# Patient Record
Sex: Female | Born: 1990 | Race: White | Hispanic: No | Marital: Single | Smoking: Never smoker
Health system: Southern US, Community
[De-identification: ages and names within clinical notes are randomized; demographics above are authoritative.]

## PROBLEM LIST (undated history)

## (undated) DIAGNOSIS — R87619 Unspecified abnormal cytological findings in specimens from cervix uteri: Secondary | ICD-10-CM

## (undated) DIAGNOSIS — H5021 Vertical strabismus, right eye: Secondary | ICD-10-CM

## (undated) DIAGNOSIS — R29898 Other symptoms and signs involving the musculoskeletal system: Secondary | ICD-10-CM

## (undated) HISTORY — PX: CLOSED REDUCTION FOREARM FRACTURE: SHX960

## (undated) HISTORY — DX: Unspecified abnormal cytological findings in specimens from cervix uteri: R87.619

---
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
## 2009-01-06 ENCOUNTER — Other Ambulatory Visit: Admission: RE | Admit: 2009-01-06 | Discharge: 2009-01-06 | Payer: Self-pay | Admitting: Obstetrics and Gynecology

## 2009-01-06 DIAGNOSIS — R87619 Unspecified abnormal cytological findings in specimens from cervix uteri: Secondary | ICD-10-CM

## 2009-01-06 HISTORY — DX: Unspecified abnormal cytological findings in specimens from cervix uteri: R87.619

## 2011-03-08 NOTE — Op Note (Signed)
Ellen Mosley, Ellen Mosley              ACCOUNT NO.:  [id number]   MEDICAL RECORD NO.:  [id number]          PATIENT TYPE:  AMB   LOCATION:  DSC                          FACILITY:  [facility]   PHYSICIAN:  Cindee Salt, M.D.       DATE OF BIRTH:  [birth year]   DATE OF PROCEDURE:  09/18/2004  DATE OF DISCHARGE:                                 OPERATIVE REPORT   PREOPERATIVE DIAGNOSIS:  Cat bite left hand.   POSTOPERATIVE DIAGNOSIS:  Cat bite left hand.   OPERATION:  Incision and drainage multiple bites index finger, and incision  and drainage of metacarpophalangeal joint ring finger.   SURGEON:  Cindee Salt, M.D.   ASSISTANT:  Chelsea Primus, M.D.   HISTORY:  The patient is a 20 year old female who suffered multiple cat  bites to her left hand.  This occurred approximately two days prior to being  seen.  She is referred from her pediatrician.   PHYSICAL EXAMINATION:  Reveals pain and swelling over the  metacarpophalangeal joint.  She has small pustules present on the index,  with bites on the middle.   PROCEDURE:  The patient was brought to the operating room, where a general  anesthetic was carried out without difficulty.  She was prepped using  DuraPrep in the supine position, left arm free.  The limb was exsanguinated  with an Esmarch bandage from the wrist proximally.  Tourniquet placed high  on the arm was inflated to 250 mmHg.  Incisions were made over each one of  the pustular areas.  The index finger had two small areas.  Each were  opened, irrigated, and packed.  These proceeded to the subcutaneous tissue.  A bite over the ring finger showed gross inflammation.  The wound was  extended proximally and distally to the ulnar side of the extensor tendon.  Cultures were taken for both aerobic and anaerobic cultures.  The dissection  was then carried down to the extensor tendon.  A puncture wound was noted  through the sagittal fibers on the ulnar aspect.  This area was opened and  found to  enter into the joint.  The joint was opened, copiously irrigated  with saline.  Each of the wounds was then packed with  sterile compressive dressing, and splint was applied.  The patient tolerated  the procedure well.  Prior to discharge from the operating room, she was  given 3 g of Unasyn.  She is discharged home to return to the [facility]  [place] on Friday, on Tylenol No. 3 and Augmentin.       GK/MEDQ  D:  09/18/2004  T:  09/18/2004  Job:  [id number]   cc:   Cindee Salt, M.D.  [address]  [place]  [place] [address]  Fax: [phone]

## 2012-03-24 DIAGNOSIS — F419 Anxiety disorder, unspecified: Secondary | ICD-10-CM | POA: Insufficient documentation

## 2012-04-27 ENCOUNTER — Emergency Department (HOSPITAL_COMMUNITY)
Admission: EM | Admit: 2012-04-27 | Discharge: 2012-04-27 | Disposition: A | Discharge status: Home / Self Care | Payer: BC Managed Care – PPO | Attending: Emergency Medicine | Admitting: Emergency Medicine

## 2012-04-27 ENCOUNTER — Encounter (HOSPITAL_COMMUNITY): Payer: Self-pay | Admitting: *Deleted

## 2012-04-27 DIAGNOSIS — S99919A Unspecified injury of unspecified ankle, initial encounter: Secondary | ICD-10-CM | POA: Insufficient documentation

## 2012-04-27 DIAGNOSIS — S8990XA Unspecified injury of unspecified lower leg, initial encounter: Secondary | ICD-10-CM | POA: Insufficient documentation

## 2012-04-27 DIAGNOSIS — X58XXXA Exposure to other specified factors, initial encounter: Secondary | ICD-10-CM | POA: Insufficient documentation

## 2012-04-27 DIAGNOSIS — S99929A Unspecified injury of unspecified foot, initial encounter: Secondary | ICD-10-CM

## 2012-04-27 NOTE — ED Notes (Signed)
The pt has  A loose nail on her rt great toe.  She was attempting to remove her boot and her nail came loose from the nail bed.  No bleeding

## 2012-04-27 NOTE — Progress Notes (Signed)
Orthopedic Tech Progress Note Patient Details:  Ellen Mosley December 24, 1990 [id number]  Ortho Devices Type of Ortho Device: Postop boot Ortho Device/Splint Location: (R) LE Ortho Device/Splint Interventions: Application   Jennye Moccasin 04/27/2012, 8:11 PM

## 2012-04-27 NOTE — ED Provider Notes (Signed)
History    This chart was scribed for Ellen Baker, MD, MD by Smitty Pluck. The patient was seen in room TR09C and the patient's care was started at 7:18PM.   CSN: [id number]  Arrival date & time 04/27/12  1706   First MD Initiated Contact with Patient 04/27/12 1917      Chief Complaint  Patient presents with  . Toe Injury    (Consider location/radiation/quality/duration/timing/severity/associated sxs/prior treatment) The history is provided by the patient.   Ellen Mosley is a 21 y.o. female who presents to the Emergency Department complaining of right great toe injury onset today. Pt was removing her riding boot and her nail of great toe came loose from toe bed. There is no active bleeding. Pain has been constant without radiation. Denies any other pain.   History reviewed. No pertinent past medical history.  History reviewed. No pertinent past surgical history.  No family history on file.  History  Substance Use Topics  . Smoking status: Never Smoker   . Smokeless tobacco: Not on file  . Alcohol Use: Yes    OB History    Grav Para Term Preterm Abortions TAB SAB Ect Mult Living                  Review of Systems  Constitutional: Negative for fever and chills.  Respiratory: Negative for shortness of breath.   Gastrointestinal: Negative for nausea and vomiting.  Neurological: Negative for weakness.    Allergies  Review of patient's allergies indicates no known allergies.  Home Medications   Current Outpatient Rx  Name Route Sig Dispense Refill  . ACETAMINOPHEN 500 MG PO TABS Oral Take 1,000 mg by mouth once.      BP 118/76  Pulse 88  Temp 98.3 F (36.8 C) (Oral)  Resp 18  SpO2 100%  Physical Exam  Nursing note and vitals reviewed. Constitutional: She is oriented to person, place, and time. She appears well-developed and well-nourished. No distress.  HENT:  Head: Normocephalic and atraumatic.  Eyes: EOM are normal. Pupils are equal, round, and  reactive to light.  Neck: Normal range of motion. Neck supple. No tracheal deviation present.  Cardiovascular: Normal rate.   Pulmonary/Chest: Effort normal. No respiratory distress.  Abdominal: Soft. She exhibits no distension.  Musculoskeletal:       No subungual hematoma of right great toe  Dry blood at junction of toenail and skin of right great toe Full ROM at right great toe   Neurological: She is alert and oriented to person, place, and time.  Skin: Skin is warm and dry.  Psychiatric: She has a normal mood and affect. Her behavior is normal.    ED Course  Procedures (including critical care time) DIAGNOSTIC STUDIES: Oxygen Saturation is 100% on room air, normal by my interpretation.    COORDINATION OF CARE:    Labs Reviewed - No data to display No results found.   No diagnosis found.    MDM  Toe nail polish removed and no evidence of nail bed injury-nail is attached to the toe--wound cleaned copiusly and dressed by nursing with bacitracin  I personally performed the services described in this documentation, which was scribed in my presence. The recorded information has been reviewed and considered.         Ellen Baker, MD 04/27/12 [phone]

## 2013-01-15 ENCOUNTER — Encounter: Payer: Self-pay | Admitting: *Deleted

## 2013-01-15 ENCOUNTER — Telehealth: Payer: Self-pay | Admitting: Nurse Practitioner

## 2013-01-15 ENCOUNTER — Encounter: Payer: Self-pay | Admitting: Nurse Practitioner

## 2013-01-15 ENCOUNTER — Ambulatory Visit (INDEPENDENT_AMBULATORY_CARE_PROVIDER_SITE_OTHER): Payer: BC Managed Care – PPO | Admitting: Nurse Practitioner

## 2013-01-15 VITALS — BP 120/70 | HR 64 | Resp 16 | Wt 142.0 lb

## 2013-01-15 DIAGNOSIS — N83209 Unspecified ovarian cyst, unspecified side: Secondary | ICD-10-CM

## 2013-01-15 MED ORDER — ETONOGESTREL-ETHINYL ESTRADIOL 0.12-0.015 MG/24HR VA RING: VAGINAL_RING | VAGINAL | Status: DC

## 2013-01-15 NOTE — Telephone Encounter (Signed)
Spoke to pt who has been having occasional pelvic pains and "twinges" on right side near hipbone, more noticeable when pt active. Pt also interested in restarting Nuvaring, which she stopped about 6 mon ago due to acne.  Pt noticed that even on OCP she still was having acne. Pt has seen dermatologist and gotten acne cleared up. Would like to try nuvaring again because she liked it better than OCP. Sched OV today with PG at 3:30 per pt request. aa

## 2013-01-15 NOTE — Telephone Encounter (Signed)
question re: starting nuvaring again and also having pelvic pain/

## 2013-01-15 NOTE — Progress Notes (Deleted)
Subjective:     Patient ID: Ellen Mosley, female   DOB: 07/29/1991, 22 y.o.   MRN: [id number]  Pelvic Pain The patient's primary symptoms include pelvic pain. The patient's pertinent negatives include no genital itching, genital lesions, genital odor, genital rash, missed menses, vaginal bleeding or vaginal discharge. This is a new problem. The current episode started in the past 7 days. The problem occurs intermittently. The problem has been unchanged. The pain is moderate. The problem affects both sides. She is not pregnant. Associated symptoms include abdominal pain. Pertinent negatives include no anorexia, back pain, chills, constipation, diarrhea, dysuria, fever, flank pain, frequency, headaches, hematuria, joint swelling, nausea, painful intercourse, rash, sore throat or urgency.  This patient complains of onset RLQ pain on 01/11/13.   Initially pain scale 7/10 but since then intermittent and not as painful.  Her last menses about 12/30/12 and decided to come off the pill Ortho LO to give herself a break because acne got so bad.  She has not been sexually active for 3 months, but is with the same partner. She is on Dorox from dermatologist and initially felt symptoms were from taking this medication.  She had no GI symptoms  during this time.  No breakthrough bleeding.  Pain was aggravated with putting right foot forward to take a step or go up the stairs. No history of injury or trauma and no changes other than trying to be active.   Review of Systems  Constitutional: Positive for activity change. Negative for fever and chills.  HENT: Negative for sore throat.   Respiratory: Negative.   Cardiovascular: Negative.   Gastrointestinal: Positive for abdominal pain. Negative for nausea, diarrhea, constipation, abdominal distention and anorexia.  Endocrine: Negative.   Genitourinary: Positive for pelvic pain. Negative for dysuria, urgency, frequency, hematuria, flank pain, vaginal discharge and missed  menses.  Musculoskeletal: Negative for back pain.  Skin: Negative for rash.  Neurological: Negative.  Negative for headaches.  Psychiatric/Behavioral: Negative.        Objective:   Physical Exam  Constitutional: She appears well-developed and well-nourished.  HENT:  Head: Normocephalic.  Cardiovascular: Normal rate and regular rhythm.   Abdomen     Assessment:     ***    Plan:     ***

## 2013-01-15 NOTE — Patient Instructions (Addendum)
Ovarian Cyst The ovaries are small organs that are on each side of the uterus. The ovaries are the organs that produce the female hormones, estrogen and progesterone. An ovarian cyst is a sac filled with fluid that can vary in its size. It is normal for a small cyst to form in women who are in the childbearing age and who have menstrual periods. This type of cyst is called a follicle cyst that becomes an ovulation cyst (corpus luteum cyst) after it produces the women's egg. It later goes away on its own if the woman does not become pregnant. There are other kinds of ovarian cysts that may cause problems and may need to be treated. The most serious problem is a cyst with cancer. It should be noted that menopausal women who have an ovarian cyst are at a higher risk of it being a cancer cyst. They should be evaluated very quickly, thoroughly and followed closely. This is especially true in menopausal women because of the high rate of ovarian cancer in women in menopause. CAUSES AND TYPES OF OVARIAN CYSTS:  FUNCTIONAL CYST: The follicle/corpus luteum cyst is a functional cyst that occurs every month during ovulation with the menstrual cycle. They go away with the next menstrual cycle if the woman does not get pregnant. Usually, there are no symptoms with a functional cyst.  ENDOMETRIOMA CYST: This cyst develops from the lining of the uterus tissue. This cyst gets in or on the ovary. It grows every month from the bleeding during the menstrual period. It is also called a "chocolate cyst" because it becomes filled with blood that turns brown. This cyst can cause pain in the lower abdomen during intercourse and with your menstrual period.  CYSTADENOMA CYST: This cyst develops from the cells on the outside of the ovary. They usually are not cancerous. They can get very big and cause lower abdomen pain and pain with intercourse. This type of cyst can twist on itself, cut off its blood supply and cause severe pain. It  also can easily rupture and cause a lot of pain.  DERMOID CYST: This type of cyst is sometimes found in both ovaries. They are found to have different kinds of body tissue in the cyst. The tissue includes skin, teeth, hair, and/or cartilage. They usually do not have symptoms unless they get very big. Dermoid cysts are rarely cancerous.  POLYCYSTIC OVARY: This is a rare condition with hormone problems that produces many small cysts on both ovaries. The cysts are follicle-like cysts that never produce an egg and become a corpus luteum. It can cause an increase in body weight, infertility, acne, increase in body and facial hair and lack of menstrual periods or rare menstrual periods. Many women with this problem develop type 2 diabetes. The exact cause of this problem is unknown. A polycystic ovary is rarely cancerous.  THECA LUTEIN CYST: Occurs when too much hormone (human chorionic gonadotropin) is produced and over-stimulates the ovaries to produce an egg. They are frequently seen when doctors stimulate the ovaries for invitro-fertilization (test tube babies).  LUTEOMA CYST: This cyst is seen during pregnancy. Rarely it can cause an obstruction to the birth canal during labor and delivery. They usually go away after delivery. SYMPTOMS   Pelvic pain or pressure.  Pain during sexual intercourse.  Increasing girth (swelling) of the abdomen.  Abnormal menstrual periods.  Increasing pain with menstrual periods.  You stop having menstrual periods and you are not pregnant. DIAGNOSIS  The diagnosis can   be made during:  Routine or annual pelvic examination (common).  Ultrasound.  X-ray of the pelvis.  CT Scan.  MRI.  Blood tests. TREATMENT   Treatment may only be to follow the cyst monthly for 2 to 3 months with your caregiver. Many go away on their own, especially functional cysts.  May be aspirated (drained) with a long needle with ultrasound, or by laparoscopy (inserting a tube into  the pelvis through a small incision).  The whole cyst can be removed by laparoscopy.  Sometimes the cyst may need to be removed through an incision in the lower abdomen.  Hormone treatment is sometimes used to help dissolve certain cysts.  Birth control pills are sometimes used to help dissolve certain cysts. HOME CARE INSTRUCTIONS  Follow your caregiver's advice regarding:  Medicine.  Follow up visits to evaluate and treat the cyst.  You may need to come back or make an appointment with another caregiver, to find the exact cause of your cyst, if your caregiver is not a gynecologist.  Get your yearly and recommended pelvic examinations and Pap tests.  Let your caregiver know if you have had an ovarian cyst in the past. SEEK MEDICAL CARE IF:   Your periods are late, irregular, they stop, or are painful.  Your stomach (abdomen) or pelvic pain does not go away.  Your stomach becomes larger or swollen.  You have pressure on your bladder or trouble emptying your bladder completely.  You have painful sexual intercourse.  You have feelings of fullness, pressure, or discomfort in your stomach.  You lose weight for no apparent reason.  You feel generally ill.  You become constipated.  You lose your appetite.  You develop acne.  You have an increase in body and facial hair.  You are gaining weight, without changing your exercise and eating habits.  You think you are pregnant. SEEK IMMEDIATE MEDICAL CARE IF:   You have increasing abdominal pain.  You feel sick to your stomach (nausea) and/or vomit.  You develop a fever that comes on suddenly.  You develop abdominal pain during a bowel movement.  Your menstrual periods become heavier than usual. Document Released: 10/07/2005 Document Revised: 12/30/2011 Document Reviewed: 08/10/2009 [facility] Patient Information 2013 [place], [place].   Take 3-4 Advil prn for pain, rest over the weekend.  If symptoms get worse to  call back.  Start Nuva ring after next cycle.

## 2013-01-15 NOTE — Progress Notes (Signed)
Subjective:     Patient ID: Ellen Mosley, female   DOB: 1991-04-20, 22 y.o.   MRN: [id number]  Pelvic Pain The patient's primary symptoms include pelvic pain. Primary symptoms comment: Patient states the pain had a sudden onset on 01/11/13 RLQ with radiation to the left. Describes the pain is sharp and achy at times.  Pain scale at onset was 7/10. But now somewhat improved and intermittent in nature.. This is a new problem. The problem has been gradually improving. The problem affects both sides. She is not pregnant. Associated symptoms include abdominal pain. Pertinent negatives include no constipation, diarrhea, dysuria, flank pain, hematuria, nausea or vomiting. Exacerbated by: Going up stairs especially starting on right foot. She has tried NSAIDs for the symptoms. The treatment provided mild relief. Sexual activity: Not sexually active for past 3 months, but still with same partner.  No concerns about STD's. Contraceptive use: She was on Ortho Lo until this past cycle when she discontinued because acne was flared.  Since then has seen dermatologist and is now on Doxycycline.  She desires to go back on [employer]. Her menstrual history has been regular. (Abnormal pap with LGSIL and colpo without biopsy 10/10 )     Review of Systems  Constitutional: Positive for activity change.       Some increase in activity at the Gym until this past week without history of injury or trauma.  Respiratory: Negative.   Cardiovascular: Negative.   Gastrointestinal: Positive for abdominal pain. Negative for nausea, vomiting, diarrhea, constipation, blood in stool, abdominal distention, anal bleeding and rectal pain.       Abdominal pain as noted without history of IBS.  Endocrine: Negative.   Genitourinary: Positive for pelvic pain. Negative for dysuria, hematuria and flank pain.       LMP about 12/30/12 and was regular.  Musculoskeletal:       No injury and back injury. No sciatica.  Skin:       As noted a  flare of acne. Now on Doxy.  Psychiatric/Behavioral: Negative.        Objective:   Physical Exam  Constitutional: She appears well-developed and well-nourished. No distress.  Cardiovascular: Normal rate and regular rhythm.   Pulmonary/Chest: Effort normal and breath sounds normal.  Abdominal: Soft. Normal appearance. She exhibits no distension, no pulsatile liver and no mass. There is no hepatosplenomegaly. There is tenderness in the right lower quadrant. There is no rebound, no guarding and no CVA tenderness.  Genitourinary: Vagina normal. No erythema, tenderness or bleeding around the vagina. No vaginal discharge found.  Bimanual with tenderness on deep palpation on right.  No obvious mass but tenderness consistent with ovarian origin.  Lymphadenopathy:       Right: No inguinal adenopathy present.       Left: No inguinal adenopathy present.  Neurological: She is alert.  Skin: Skin is warm and dry.  Psychiatric: She has a normal mood and affect. Her speech is normal and behavior is normal. Judgment normal. Cognition and memory are normal.       Assessment:     Pelvic / Abdominal pain most consistent with Ovarian Cyst  Wants to restart Nuva Ring for birth control and cycle regulation and hopes that she will get acne control as well.    Plan:      Samples of Nuva Ring X 3 months.  May need new Rx before AEX and to call back  OTC NSAID'S prn for abdominal pain.  Warm heating  pad prn.  Call back if symptoms worsens in any way.  Rest over the weekend.

## 2013-01-15 NOTE — Telephone Encounter (Signed)
[id number] 

## 2013-01-18 NOTE — Progress Notes (Signed)
Encounter reviewed by Dr. Brook Silva.  

## 2013-04-28 ENCOUNTER — Telehealth: Payer: Self-pay | Admitting: Nurse Practitioner

## 2013-04-28 MED ORDER — ETONOGESTREL-ETHINYL ESTRADIOL 0.12-0.015 MG/24HR VA RING: VAGINAL_RING | VAGINAL | Status: DC

## 2013-04-28 NOTE — Telephone Encounter (Signed)
Left message for patient that her rx for Nuvaring was sent to the pharmacy #[id number] and AEX is due after 07/13/13. She will need to call back and reschedule this.

## 2013-04-28 NOTE — Telephone Encounter (Signed)
Please call in refill for nuvaring  [facility] [address]

## 2013-05-10 ENCOUNTER — Telehealth: Payer: Self-pay | Admitting: Nurse Practitioner

## 2013-05-10 NOTE — Telephone Encounter (Signed)
Pt's Rx for Nuv[facility] was sent to [facility] not [facility] per pt's request. Pt advised to call [facility] pharmacy and have Rx transferred. Pt is happy and agreeable.  

## 2013-05-10 NOTE — Telephone Encounter (Signed)
Pt's Rx for Nuvaring was sent to [facility] per pt's [facility]. Pt advised to call [facility] pharmacy and have Rx transferred. Pt is happy and agreeable.

## 2013-05-10 NOTE — Telephone Encounter (Signed)
Patient left message on answering machine 05/07/13 at 9:12 PM re: recent refill sent to wrong pharmacy and will cost patient more money there. Patient requests we call [facility] [address] [facility]

## 2013-05-14 ENCOUNTER — Ambulatory Visit: Payer: BC Managed Care – PPO | Admitting: Nurse Practitioner

## 2013-05-17 ENCOUNTER — Ambulatory Visit (INDEPENDENT_AMBULATORY_CARE_PROVIDER_SITE_OTHER): Payer: BC Managed Care – PPO | Admitting: Nurse Practitioner

## 2013-05-17 ENCOUNTER — Encounter: Payer: Self-pay | Admitting: Nurse Practitioner

## 2013-05-17 VITALS — BP 102/60 | HR 74 | Resp 12 | Ht 69.0 in | Wt 145.6 lb

## 2013-05-17 DIAGNOSIS — Z975 Presence of (intrauterine) contraceptive device: Secondary | ICD-10-CM

## 2013-05-17 NOTE — Progress Notes (Signed)
Subjective:     Patient ID: Ellen Mosley, female   DOB: 10/11/1991, 22 y.o.   MRN: [id number]  HPI 22 yo [place] who was on Nuva Ring for contraception until 1 month ago.  She comes in a consult visit seeking other methods of cycle regulation and birth control. Will be traveling after graduation to [place] and wants to use Mirena IUD.  Not been sexually active dating since 11/2012.  She has a friend with a Mirena IUD and has done well ans she likes the idea of none to light menses.   Review of Systems  Constitutional: Negative.   Respiratory: Negative.   Cardiovascular: Negative.   Gastrointestinal: Negative.   Genitourinary: Negative.   Skin: Negative.        Slight acne.  Neurological: Negative.   Psychiatric/Behavioral: Negative.        Objective:   Physical Exam  Constitutional: She appears well-developed and well-nourished.  Exam is not indicated today.  Psychiatric: She has a normal mood and affect. Her behavior is normal. Judgment and thought content normal.       Assessment:     Desires Mirena IUD    Plan:    If as planned, her menses starts this Thursday or Friday will want IUD this week.  We will get insurance information sent and follow.   Consult time with patient 15 minutes.  Information is given for her to review.

## 2013-05-17 NOTE — Patient Instructions (Signed)
We will call you when insurance approval and get done later this week for Mirena IUD, or early next week if cycle is later than expected.

## 2013-05-19 NOTE — Progress Notes (Signed)
Encounter reviewed by Dr. Thermon Zulauf Silva.  

## 2013-05-21 ENCOUNTER — Ambulatory Visit: Payer: BC Managed Care – PPO | Admitting: Obstetrics and Gynecology

## 2013-05-21 ENCOUNTER — Telehealth: Payer: Self-pay | Admitting: Obstetrics and Gynecology

## 2013-05-21 NOTE — Telephone Encounter (Signed)
Patient called to cancel IUD placement . Patient left message this mornings messages to cancel . Could not understand the reason for her cancellation. Tried to call back . Had to leave a message on her phone.

## 2013-05-24 ENCOUNTER — Telehealth: Payer: Self-pay | Admitting: Nurse Practitioner

## 2013-05-24 NOTE — Telephone Encounter (Signed)
Call to patient to advise $35 copay for appt 8/5. Voicemail is for her work. Did not leave a message.

## 2013-05-24 NOTE — Telephone Encounter (Signed)
Spoke with pt to advise that CR did not require her to take cytotec. Pt agreeable.

## 2013-05-24 NOTE — Telephone Encounter (Addendum)
Spoke with pt who started menses 05-21-13. Has discussed Mirena with PG at earlier visit. Desires placement. Per SY, appt sched with CR tomorrow at 3:30. Pt agreeable. Instructed pt to take 800 mg of ibuprofen an hour before the appt with food to help with cramping. Informed pt I would inquire about med Cytotec and call her back if it is needed. Advised someone from insurance would be calling with her OOP cost.

## 2013-05-24 NOTE — Telephone Encounter (Signed)
Patient has started her period on Friday . Needs an appointment for her IUD placement.

## 2013-05-25 ENCOUNTER — Ambulatory Visit (INDEPENDENT_AMBULATORY_CARE_PROVIDER_SITE_OTHER): Payer: BC Managed Care – PPO | Admitting: Obstetrics and Gynecology

## 2013-05-25 ENCOUNTER — Encounter: Payer: Self-pay | Admitting: Obstetrics and Gynecology

## 2013-05-25 VITALS — BP 102/60 | HR 74 | Resp 18 | Wt 146.0 lb

## 2013-05-25 DIAGNOSIS — Z30430 Encounter for insertion of intrauterine contraceptive device: Secondary | ICD-10-CM

## 2013-05-25 DIAGNOSIS — Z975 Presence of (intrauterine) contraceptive device: Secondary | ICD-10-CM

## 2013-05-25 NOTE — Progress Notes (Signed)
22 yo Single Caucasian female presents for  insertion of Mirena. Risks and possible complications of this method of birth control fully discussed with patient.  She states she understands and wishes to proceed. Informed consent obtained.    Patient's last menstrual period was 05/21/2013.  Exam:  Healthy appearing female  Abd:  Soft and NT  Pelvic:  Ext genitalia normal female              Vagina normal              Cervix appears nl with no sign of infection              Bimanual exam:     Procedure:  Speculum inserted into vagina. Cervix visualized and cleansed with betadine solution X 3. Cervix grasped on its anterior lip with a single tooth tenaculum.  Uterus sounded to 7 centimeters.  IUD removed from sterile packet and under sterile conditions inserted to fundus of uterus.  Introducer removed without difficulty.  IUD string trimmed to 3 centimeters.  Remainder string given to patient to feel for identification.  Tenaculum removed  Speculum removed.  Uterus palpated normal.  Patient tolerated procedure well.  A: Insertion of Mirena   Lot number:   [id number]      Exp: 08/16   P:  Instructions and warnings signs given.       IUD identification card given with IUD removal        F/U 1 month for IUD recheck.   AVS given to patient with post IUD care instructions.

## 2013-05-25 NOTE — Patient Instructions (Signed)
Intrauterine Device Insertion Intrauterine Device Insertion Care After Refer to this sheet in the next few weeks. These instructions provide you with information on caring for yourself after your procedure. Your caregiver may also give you more specific instructions. Your treatment has been planned according to current medical practices, but problems sometimes occur. Call your caregiver if you have any problems or questions after your procedure. HOME CARE INSTRUCTIONS   Only take over-the-counter or prescription medicines for pain, discomfort, or fever as directed by your caregiver. Do not use aspirin. This may increase bleeding.  Check your IUD to make sure it is in place before you resume sexual activity. You should be able to feel the strings. If you cannot feel the strings, something may be wrong. The IUD may have fallen out of the uterus, or the uterus may have been punctured (perforated) during placement. Also, if the strings are getting longer, it may mean that the IUD is being forced out of the uterus. You no longer have full protection from pregnancy if any of these problems occur.  You may resume sexual intercourse if you are not having problems with the IUD. The IUD is considered immediately effective.  You may resume normal activities.  Keep all follow-up appointments to be sure your IUD has remained in place. After the first exam, yearly exams are advised, unless you cannot feel the strings of your IUD.  Continue to check that the IUD is still in place by feeling for the strings after every menstrual period. SEEK MEDICAL CARE IF:   You have bleeding that is heavier or lasts longer than a normal menstrual cycle.  You have a fever.  You have increasing cramps or abdominal pain not relieved with medicine.  You have abdominal pain that does not seem to be related to the same area of earlier cramping and pain.  You are lightheaded, unusually weak, or faint.  You have abnormal  vaginal discharge or smells.  You have pain during sexual intercourse.  You cannot feel the IUD strings, or the IUD string has gotten longer.  You feel the IUD at the opening of the cervix in the vagina.  You think you are pregnant, or you miss your menstrual period.  The IUD string is hurting your sex partner. Document Released: 06/05/2011 Document Revised: 12/30/2011 Document Reviewed: 06/05/2011 [facility] Patient Information 2014 [place], [place].

## 2013-06-23 ENCOUNTER — Ambulatory Visit: Payer: BC Managed Care – PPO | Admitting: Obstetrics and Gynecology

## 2013-06-28 ENCOUNTER — Ambulatory Visit: Payer: BC Managed Care – PPO | Admitting: Obstetrics and Gynecology

## 2013-07-05 ENCOUNTER — Telehealth: Payer: Self-pay | Admitting: Obstetrics and Gynecology

## 2013-07-05 ENCOUNTER — Ambulatory Visit: Payer: Self-pay | Admitting: Obstetrics and Gynecology

## 2013-07-05 NOTE — Telephone Encounter (Signed)
Patient canceled 4 week reck appointment with Dr.Silva today.  Patient rescheduled to 07/16/13 with Dr.Silva.

## 2013-07-16 ENCOUNTER — Ambulatory Visit: Payer: Self-pay | Admitting: Obstetrics and Gynecology

## 2013-07-19 ENCOUNTER — Encounter: Payer: Self-pay | Admitting: Obstetrics and Gynecology

## 2013-07-19 ENCOUNTER — Ambulatory Visit (INDEPENDENT_AMBULATORY_CARE_PROVIDER_SITE_OTHER): Payer: BC Managed Care – PPO | Admitting: Obstetrics and Gynecology

## 2013-07-19 VITALS — BP 122/76 | HR 64 | Ht 69.0 in | Wt 143.5 lb

## 2013-07-19 DIAGNOSIS — Z30431 Encounter for routine checking of intrauterine contraceptive device: Secondary | ICD-10-CM

## 2013-07-19 NOTE — Patient Instructions (Signed)
Please return for your annual exam appointment.  It was so nice to meet you!

## 2013-07-19 NOTE — Progress Notes (Signed)
Patient ID: Ellen Mosley, female   DOB: 1991-02-04, 22 y.o.   MRN: [id number]  Subjective  Patient is here for a Mirena IUD check.  IUD placed on 05/25/13.   Loves it! Spotted for only a few days after insertion, but no periods.  Had some cramping two weeks after IUD placed, but now gone.   Not sexually active.  Wants to do equine therapy and open her own facility.  Is an [place] major.  Teaches riding.  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  Objective  Pelvic - normal external genitalia and urethra. Cervix - IUD strings noted - 1 inch in length, trimmed to 1/2 inch. Uterus - small and nontender. Adnexa - no masses or tenderness.  Assessment  Mirena IUD patient. Doing well.  Plan  Annual examination is due.  Patient will schedule.

## 2013-07-26 ENCOUNTER — Encounter: Payer: Self-pay | Admitting: Nurse Practitioner

## 2013-07-26 ENCOUNTER — Ambulatory Visit (INDEPENDENT_AMBULATORY_CARE_PROVIDER_SITE_OTHER): Payer: BC Managed Care – PPO | Admitting: Nurse Practitioner

## 2013-07-26 VITALS — BP 100/70 | HR 68 | Resp 16 | Ht 68.25 in | Wt 141.0 lb

## 2013-07-26 DIAGNOSIS — Z0000 Encounter for general adult medical examination without abnormal findings: Secondary | ICD-10-CM

## 2013-07-26 DIAGNOSIS — Z01419 Encounter for gynecological examination (general) (routine) without abnormal findings: Secondary | ICD-10-CM

## 2013-07-26 DIAGNOSIS — Z113 Encounter for screening for infections with a predominantly sexual mode of transmission: Secondary | ICD-10-CM

## 2013-07-26 LAB — HEMOGLOBIN, FINGERSTICK: Hemoglobin, fingerstick: 14 g/dL (ref 12.0–16.0)

## 2013-07-26 LAB — POCT URINALYSIS DIPSTICK
Bilirubin, UA: NEGATIVE
Glucose, UA: NEGATIVE
Ketones, UA: NEGATIVE
Leukocytes, UA: NEGATIVE

## 2013-07-26 LAB — STD PANEL: Hepatitis B Surface Ag: NEGATIVE

## 2013-07-26 NOTE — Patient Instructions (Addendum)
General topics  Next pap or exam is  due in 1 year Take a Women's multivitamin Take 1200 mg. of calcium daily - prefer dietary If any concerns in interim to call back  Breast Self-Awareness Practicing breast self-awareness may pick up problems early, prevent significant medical complications, and possibly save your life. By practicing breast self-awareness, you can become familiar with how your breasts look and feel and if your breasts are changing. This allows you to notice changes early. It can also offer you some reassurance that your breast health is good. One way to learn what is normal for your breasts and whether your breasts are changing is to do a breast self-exam. If you find a lump or something that was not present in the past, it is best to contact your caregiver right away. Other findings that should be evaluated by your caregiver include nipple discharge, especially if it is bloody; skin changes or reddening; areas where the skin seems to be pulled in (retracted); or new lumps and bumps. Breast pain is seldom associated with cancer (malignancy), but should also be evaluated by a caregiver. BREAST SELF-EXAM The best time to examine your breasts is 5 7 days after your menstrual period is over.  ExitCare Patient Information 2013 [employer].   Exercise to Stay Healthy Exercise helps you become and stay healthy. EXERCISE IDEAS AND TIPS Choose exercises that:  You enjoy.  Fit into your day. You do not need to exercise really hard to be healthy. You can do exercises at a slow or medium level and stay healthy. You can:  Stretch before and after working out.  Try yoga, Pilates, or tai chi.  Lift weights.  Walk fast, swim, jog, run, climb stairs, bicycle, dance, or rollerskate.  Take aerobic classes. Exercises that burn about 150 calories:  Running 1  miles in 15 minutes.  Playing volleyball for 45 to 60 minutes.  Washing and waxing a car for 45 to 60  minutes.  Playing touch football for 45 minutes.  Walking 1  miles in 35 minutes.  Pushing a stroller 1  miles in 30 minutes.  Playing basketball for 30 minutes.  Raking leaves for 30 minutes.  Bicycling 5 miles in 30 minutes.  Walking 2 miles in 30 minutes.  Dancing for 30 minutes.  Shoveling snow for 15 minutes.  Swimming laps for 20 minutes.  Walking up stairs for 15 minutes.  Bicycling 4 miles in 15 minutes.  Gardening for 30 to 45 minutes.  Jumping rope for 15 minutes.  Washing windows or floors for 45 to 60 minutes. Document Released: 11/09/2010 Document Revised: 12/30/2011 Document Reviewed: 11/09/2010 ExitCare Patient Information 2013 [employer].   Other topics ( that may be useful information):    Sexually Transmitted Disease Sexually transmitted disease (STD) refers to any infection that is passed from person to person during sexual activity. This may happen by way of saliva, semen, blood, vaginal mucus, or urine. Common STDs include:  Gonorrhea.  Chlamydia.  Syphilis.  HIV/AIDS.  Genital herpes.  Hepatitis B and C.  Trichomonas.  Human papillomavirus (HPV).  Pubic lice. CAUSES  An STD may be spread by bacteria, virus, or parasite. A person can get an STD by:  Sexual intercourse with an infected person.  Sharing sex toys with an infected person.  Sharing needles with an infected person.  Having intimate contact with the genitals, mouth, or rectal areas of an infected person. SYMPTOMS  Some people may not have any symptoms, but   they can still pass the infection to others. Different STDs have different symptoms. Symptoms include:  Painful or bloody urination.  Pain in the pelvis, abdomen, vagina, anus, throat, or eyes.  Skin rash, itching, irritation, growths, or sores (lesions). These usually occur in the genital or anal area.  Abnormal vaginal discharge.  Penile discharge in men.  Soft, flesh-colored skin growths in the  genital or anal area.  Fever.  Pain or bleeding during sexual intercourse.  Swollen glands in the groin area.  Yellow skin and eyes (jaundice). This is seen with hepatitis. DIAGNOSIS  To make a diagnosis, your caregiver may:  Take a medical history.  Perform a physical exam.  Take a specimen (culture) to be examined.  Examine a sample of discharge under a microscope.  Perform blood test TREATMENT   Chlamydia, gonorrhea, trichomonas, and syphilis can be cured with antibiotic medicine.  Genital herpes, hepatitis, and HIV can be treated, but not cured, with prescribed medicines. The medicines will lessen the symptoms.  Genital warts from HPV can be treated with medicine or by freezing, burning (electrocautery), or surgery. Warts may come back.  HPV is a virus and cannot be cured with medicine or surgery.However, abnormal areas may be followed very closely by your caregiver and may be removed from the cervix, vagina, or vulva through office procedures or surgery. If your diagnosis is confirmed, your recent sexual partners need treatment. This is true even if they are symptom-free or have a negative culture or evaluation. They should not have sex until their caregiver says it is okay. HOME CARE INSTRUCTIONS  All sexual partners should be informed, tested, and treated for all STDs.  Take your antibiotics as directed. Finish them even if you start to feel better.  Only take over-the-counter or prescription medicines for pain, discomfort, or fever as directed by your caregiver.  Rest.  Eat a balanced diet and drink enough fluids to keep your urine clear or pale yellow.  Do not have sex until treatment is completed and you have followed up with your caregiver. STDs should be checked after treatment.  Keep all follow-up appointments, Pap tests, and blood tests as directed by your caregiver.  Only use latex condoms and water-soluble lubricants during sexual activity. Do not use  petroleum jelly or oils.  Avoid alcohol and illegal drugs.  Get vaccinated for HPV and hepatitis. If you have not received these vaccines in the past, talk to your caregiver about whether one or both might be right for you.  Avoid risky sex practices that can break the skin. The only way to avoid getting an STD is to avoid all sexual activity.Latex condoms and dental dams (for oral sex) will help lessen the risk of getting an STD, but will not completely eliminate the risk. SEEK MEDICAL CARE IF:   You have a fever.  You have any new or worsening symptoms. Document Released: 12/28/2002 Document Revised: 12/30/2011 Document Reviewed: 01/04/2011 ExitCare Patient Information 2013 [employer]Care[employer].    Domestic Abuse You are being battered or abused if someone close to you hits, pushes, or physically hurts you in any way. You also are being abused if you are forced into activities. You are being sexually abused if you are forced to have sexual contact of any kind. You are being emotionally abused if you are made to feel worthless or if you are constantly threatened. It is important to remember that help is available. No one has the right to abuse you. PREVENTION OF FURTHER   ABUSE  Learn the warning signs of danger. This varies with situations but may include: the use of alcohol, threats, isolation from friends and family, or forced sexual contact. Leave if you feel that violence is going to occur.  If you are attacked or beaten, report it to the police so the abuse is documented. You do not have to press charges. The police can protect you while you or the attackers are leaving. Get the officer's name and badge number and a copy of the report.  Find someone you can trust and tell them what is happening to you: your caregiver, a nurse, clergy member, close friend or family member. Feeling ashamed is natural, but remember that you have done nothing wrong. No one deserves abuse. Document Released:  10/04/2000 Document Revised: 12/30/2011 Document Reviewed: 12/13/2010 ExitCare Patient Information 2013 [employer].    How Much is Too Much Alcohol? Drinking too much alcohol can cause injury, accidents, and health problems. These types of problems can include:   Car crashes.  Falls.  Family fighting (domestic violence).  Drowning.  Fights.  Injuries.  Burns.  Damage to certain organs.  Having a baby with birth defects. ONE DRINK CAN BE TOO MUCH WHEN YOU ARE:  Working.  Pregnant or breastfeeding.  Taking medicines. Ask your doctor.  Driving or planning to drive. If you or someone you know has a drinking problem, get help from a doctor.  Document Released: 08/03/2009 Document Revised: 12/30/2011 Document Reviewed: 08/03/2009 ExitCare Patient Information 2013 [employer].   Smoking Hazards Smoking cigarettes is extremely bad for your health. Tobacco smoke has over 200 known poisons in it. There are over 60 chemicals in tobacco smoke that cause cancer. Some of the chemicals found in cigarette smoke include:   Cyanide.  Benzene.  Formaldehyde.  Methanol (wood alcohol).  Acetylene (fuel used in welding torches).  Ammonia. Cigarette smoke also contains the poisonous gases nitrogen oxide and carbon monoxide.  Cigarette smokers have an increased risk of many serious medical problems and Smoking causes approximately:  90% of all lung cancer deaths in men.  80% of all lung cancer deaths in women.  90% of deaths from chronic obstructive lung disease. Compared with nonsmokers, smoking increases the risk of:  Coronary heart disease by 2 to 4 times.  Stroke by 2 to 4 times.  Men developing lung cancer by 23 times.  Women developing lung cancer by 13 times.  Dying from chronic obstructive lung diseases by 12 times.  . Smoking is the most preventable cause of death and disease in our society.  WHY IS SMOKING ADDICTIVE?  Nicotine is the chemical  agent in tobacco that is capable of causing addiction or dependence.  When you smoke and inhale, nicotine is absorbed rapidly into the bloodstream through your lungs. Nicotine absorbed through the lungs is capable of creating a powerful addiction. Both inhaled and non-inhaled nicotine may be addictive.  Addiction studies of cigarettes and spit tobacco show that addiction to nicotine occurs mainly during the teen years, when young people begin using tobacco products. WHAT ARE THE BENEFITS OF QUITTING?  There are many health benefits to quitting smoking.   Likelihood of developing cancer and heart disease decreases. Health improvements are seen almost immediately.  Blood pressure, pulse rate, and breathing patterns start returning to normal soon after quitting. QUITTING SMOKING   American Lung Association - 1-800-LUNGUSA  American Cancer Society - 1-800-ACS-2345 Document Released: 11/14/2004 Document Revised: 12/30/2011 Document Reviewed: 07/19/2009 ExitCare Patient Information 2013 ExitCare,   LLC.   Stress Management Stress is a state of physical or mental tension that often results from changes in your life or normal routine. Some common causes of stress are:  Death of a loved one.  Injuries or severe illnesses.  Getting fired or changing jobs.  Moving into a new home. Other causes may be:  Sexual problems.  Business or financial losses.  Taking on a large debt.  Regular conflict with someone at home or at work.  Constant tiredness from lack of sleep. It is not just bad things that are stressful. It may be stressful to:  Win the lottery.  Get married.  Buy a new car. The amount of stress that can be easily tolerated varies from person to person. Changes generally cause stress, regardless of the types of change. Too much stress can affect your health. It may lead to physical or emotional problems. Too little stress (boredom) may also become stressful. SUGGESTIONS TO  REDUCE STRESS:  Talk things over with your family and friends. It often is helpful to share your concerns and worries. If you feel your problem is serious, you may want to get help from a professional counselor.  Consider your problems one at a time instead of lumping them all together. Trying to take care of everything at once may seem impossible. List all the things you need to do and then start with the most important one. Set a goal to accomplish 2 or 3 things each day. If you expect to do too many in a single day you will naturally fail, causing you to feel even more stressed.  Do not use alcohol or drugs to relieve stress. Although you may feel better for a short time, they do not remove the problems that caused the stress. They can also be habit forming.  Exercise regularly - at least 3 times per week. Physical exercise can help to relieve that "uptight" feeling and will relax you.  The shortest distance between despair and hope is often a good night's sleep.  Go to bed and get up on time allowing yourself time for appointments without being rushed.  Take a short "time-out" period from any stressful situation that occurs during the day. Close your eyes and take some deep breaths. Starting with the muscles in your face, tense them, hold it for a few seconds, then relax. Repeat this with the muscles in your neck, shoulders, hand, stomach, back and legs.  Take good care of yourself. Eat a balanced diet and get plenty of rest.  Schedule time for having fun. Take a break from your daily routine to relax. HOME CARE INSTRUCTIONS   Call if you feel overwhelmed by your problems and feel you can no longer manage them on your own.  Return immediately if you feel like hurting yourself or someone else. Document Released: 04/02/2001 Document Revised: 12/30/2011 Document Reviewed: 11/23/2007 ExitCare Patient Information 2013 [employer]Care[employer] LLC.   

## 2013-07-26 NOTE — Progress Notes (Signed)
Patient ID: Ellen Mosley, female   DOB: 11/15/1990, 22 y.o.   MRN: [id number] 22 y.o. G0P0 Single Caucasian Fe here for annual exam.  No vaginal bleeding since Mirena IUD insertion last month.  Some cramps for 1/2 day that go away on its own without NSAID's. Not sexually active for 2-3 months. She worked again this year with the [employer]; this is her 5 th year.  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  Patient's last menstrual period was 05/21/2013.          Sexually active: yes  The current method of family planning is IUD.    Exercising: yes  Home exercise routine includes running at least 2 times per week and horseback riding daily. Smoker:  no  Health Maintenance: Pap:  07/13/12, WNL TDaP:  03/2007 Gardasil: completed in 2007 Labs: HB: 14.0 Urine: Negative, pH 6.0   reports that she has never smoked. She has never used smokeless tobacco. She reports that  drinks alcohol.  Past Medical History  Diagnosis Date  . STD (sexually transmitted disease) 05/2010  . Abnormal pap 01/10/11    hx of LSIL with colpo bx    Past Surgical History  Procedure Laterality Date  . Colposcopy    . Cosmetic surgery  2004    Current Outpatient Prescriptions  Medication Sig Dispense Refill  . acetaminophen (TYLENOL) 500 MG tablet Take 1,000 mg by mouth once.       No current facility-administered medications for this visit.    Family History  Problem Relation Age of Onset  . Osteopenia Mother   . Cancer Sister   . Breast cancer Paternal Grandmother     ROS:  Pertinent items are noted in HPI.  Otherwise, a comprehensive ROS was negative.  Exam:   BP 100/70  Pulse 68  Resp 16  Ht 5' 8.25" (1.734 m)  Wt 141 lb (63.957 kg)  BMI 21.27 kg/m2  LMP 05/21/2013 Height: 5' 8.25" (173.4 cm)  Ht Readings from Last 3 Encounters:  07/26/13 5' 8.25" (1.734 m)  07/19/13 5\' 9"  (1.753 m)  05/17/13 5\' 9"   (1.753 m)    General appearance: alert, cooperative and appears stated age Head: Normocephalic, without obvious abnormality, atraumatic Neck: no adenopathy, supple, symmetrical, trachea midline and thyroid normal to inspection and palpation Lungs: clear to auscultation bilaterally Breasts: normal appearance, no masses or tenderness Heart: regular rate and rhythm Abdomen: soft, non-tender; no masses,  no organomegaly Extremities: extremities normal, atraumatic, no cyanosis or edema Skin: Skin color, texture, turgor normal. No rashes or lesions Lymph nodes: Cervical, supraclavicular, and axillary nodes normal. No abnormal inguinal nodes palpated Neurologic: Grossly normal   Pelvic: External genitalia:  no lesions              Urethra:  normal appearing urethra with no masses, tenderness or lesions              Bartholin's and Skene's: normal                 Vagina: normal appearing vagina with normal color and discharge, no lesions              Cervix: anteverted and IUD strings are visible.              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

## 2013-07-30 NOTE — Progress Notes (Signed)
Encounter reviewed by Dr. Agnieszka Newhouse Silva.  

## 2014-01-19 DIAGNOSIS — H5021 Vertical strabismus, right eye: Secondary | ICD-10-CM

## 2014-01-19 HISTORY — DX: Vertical strabismus, right eye: H50.21

## 2014-01-31 ENCOUNTER — Encounter (HOSPITAL_BASED_OUTPATIENT_CLINIC_OR_DEPARTMENT_OTHER): Payer: Self-pay | Admitting: *Deleted

## 2014-02-01 ENCOUNTER — Other Ambulatory Visit: Payer: Self-pay | Admitting: Ophthalmology

## 2014-02-01 ENCOUNTER — Encounter (HOSPITAL_BASED_OUTPATIENT_CLINIC_OR_DEPARTMENT_OTHER): Payer: Self-pay | Admitting: *Deleted

## 2014-02-01 NOTE — H&P (Signed)
  Date of examination:  01-12-14  Indication for surgery: To straighten the eyes and relieve double vision  Pertinent past medical history:  Past Medical History  Diagnosis Date  . 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 Jaw clicking     Pertinent ocular history:  Head trauma 2004, right orbital fracture s/p repair  Pertinent family history:  Family History  Problem Relation Age of Onset  . Osteopenia Mother   . Anesthesia problems Mother     post-op N/V  . Cancer Maternal Aunt   . Cancer Maternal Grandmother   . Anesthesia problems Father     post-op N/V    General:  Healthy appearing patient in no distress.    Eyes:    Acuity cc    OD 20/20  OS 20/20  External: Within normal limits     Anterior segment: Within normal limits     Motility:   R H(T) = 6 in primary.  0 in L gaze, 30 in R gaze.  + HTT to right.  DMR 3 deg excyclo OD.  3+ RIO OA  Refraction: Manifest low minus/mild cyl OU  Heart: Regular rate and rhythm without murmur     Lungs: Clear to auscultation     Abdomen: Soft, nontender, normal bowel sounds     Impression:RH(T) c/w RSO palsy, traumatic by hx  Plan: RIO recess  Shara BlazingWilliam O Corlis Angelica

## 2014-02-04 ENCOUNTER — Ambulatory Visit (HOSPITAL_BASED_OUTPATIENT_CLINIC_OR_DEPARTMENT_OTHER)
Admission: RE | Admit: 2014-02-04 | Discharge: 2014-02-04 | Disposition: A | Discharge status: Home / Self Care | Payer: 59 | Source: Ambulatory Visit | Attending: Ophthalmology | Admitting: Ophthalmology

## 2014-02-04 ENCOUNTER — Ambulatory Visit (HOSPITAL_BASED_OUTPATIENT_CLINIC_OR_DEPARTMENT_OTHER): Payer: 59 | Admitting: Anesthesiology

## 2014-02-04 ENCOUNTER — Encounter (HOSPITAL_BASED_OUTPATIENT_CLINIC_OR_DEPARTMENT_OTHER): Payer: Self-pay | Admitting: Anesthesiology

## 2014-02-04 ENCOUNTER — Encounter (HOSPITAL_BASED_OUTPATIENT_CLINIC_OR_DEPARTMENT_OTHER)
Admission: RE | Disposition: A | Discharge status: Home / Self Care | Payer: Self-pay | Source: Ambulatory Visit | Attending: Ophthalmology

## 2014-02-04 ENCOUNTER — Encounter (HOSPITAL_BASED_OUTPATIENT_CLINIC_OR_DEPARTMENT_OTHER): Payer: 59 | Admitting: Anesthesiology

## 2014-02-04 DIAGNOSIS — Z8781 Personal history of (healed) traumatic fracture: Secondary | ICD-10-CM | POA: Insufficient documentation

## 2014-02-04 DIAGNOSIS — IMO0002 Reserved for concepts with insufficient information to code with codable children: Secondary | ICD-10-CM | POA: Insufficient documentation

## 2014-02-04 HISTORY — DX: Vertical strabismus, right eye: H50.21

## 2014-02-04 HISTORY — DX: Other symptoms and signs involving the musculoskeletal system: R29.898

## 2014-02-04 HISTORY — PX: STRABISMUS SURGERY: SHX218

## 2014-02-04 LAB — POCT HEMOGLOBIN-HEMACUE: Hemoglobin: 14.8 g/dL (ref 12.0–15.0)

## 2014-02-04 SURGERY — REPAIR STRABISMUS
Anesthesia: General | Site: Eye | Laterality: Right

## 2014-02-04 MED ORDER — MIDAZOLAM HCL 2 MG/2ML IJ SOLN: 1.0000 mg | INTRAMUSCULAR | Status: DC | PRN

## 2014-02-04 MED ORDER — HYDROMORPHONE HCL PF 1 MG/ML IJ SOLN
INTRAMUSCULAR | Status: AC
  Filled 2014-02-04: qty 1

## 2014-02-04 MED ORDER — ONDANSETRON HCL 4 MG/2ML IJ SOLN
INTRAMUSCULAR | Status: DC | PRN
  Administered 2014-02-04: 4 mg via INTRAVENOUS

## 2014-02-04 MED ORDER — MIDAZOLAM HCL 2 MG/ML PO SYRP: 12.0000 mg | ORAL_SOLUTION | Freq: Once | ORAL | Status: DC | PRN

## 2014-02-04 MED ORDER — OXYCODONE-ACETAMINOPHEN 7.5-325 MG PO TABS: 1.0000 | ORAL_TABLET | ORAL | Status: DC | PRN

## 2014-02-04 MED ORDER — DEXAMETHASONE SODIUM PHOSPHATE 4 MG/ML IJ SOLN
INTRAMUSCULAR | Status: DC | PRN
  Administered 2014-02-04: 10 mg via INTRAVENOUS

## 2014-02-04 MED ORDER — LIDOCAINE HCL (CARDIAC) 20 MG/ML IV SOLN
INTRAVENOUS | Status: DC | PRN
  Administered 2014-02-04: 60 mg via INTRAVENOUS

## 2014-02-04 MED ORDER — MIDAZOLAM HCL 2 MG/2ML IJ SOLN
INTRAMUSCULAR | Status: AC
  Filled 2014-02-04: qty 2

## 2014-02-04 MED ORDER — FENTANYL CITRATE 0.05 MG/ML IJ SOLN
INTRAMUSCULAR | Status: AC
  Filled 2014-02-04: qty 4

## 2014-02-04 MED ORDER — FENTANYL CITRATE 0.05 MG/ML IJ SOLN: 50.0000 ug | INTRAMUSCULAR | Status: DC | PRN

## 2014-02-04 MED ORDER — KETOROLAC TROMETHAMINE 30 MG/ML IJ SOLN
INTRAMUSCULAR | Status: DC | PRN
  Administered 2014-02-04: 30 mg via INTRAVENOUS

## 2014-02-04 MED ORDER — OXYCODONE HCL 5 MG PO TABS: 5.0000 mg | ORAL_TABLET | Freq: Once | ORAL | Status: DC | PRN

## 2014-02-04 MED ORDER — MIDAZOLAM HCL 5 MG/5ML IJ SOLN
INTRAMUSCULAR | Status: DC | PRN
  Administered 2014-02-04: 2 mg via INTRAVENOUS

## 2014-02-04 MED ORDER — BSS IO SOLN
INTRAOCULAR | Status: AC
  Filled 2014-02-04: qty 15

## 2014-02-04 MED ORDER — FENTANYL CITRATE 0.05 MG/ML IJ SOLN
INTRAMUSCULAR | Status: DC | PRN
  Administered 2014-02-04: 100 ug via INTRAVENOUS

## 2014-02-04 MED ORDER — PROPOFOL 10 MG/ML IV BOLUS
INTRAVENOUS | Status: DC | PRN
  Administered 2014-02-04: 200 mg via INTRAVENOUS

## 2014-02-04 MED ORDER — ATROPINE SULFATE 0.4 MG/ML IJ SOLN
INTRAMUSCULAR | Status: DC | PRN
  Administered 2014-02-04: .2 mg via INTRAVENOUS

## 2014-02-04 MED ORDER — LACTATED RINGERS IV SOLN
INTRAVENOUS | Status: DC
Start: 2014-02-04 — End: 2014-02-04
  Administered 2014-02-04: 09:00:00 via INTRAVENOUS

## 2014-02-04 MED ORDER — HYDROMORPHONE HCL PF 1 MG/ML IJ SOLN
0.2500 mg | INTRAMUSCULAR | Status: DC | PRN
  Administered 2014-02-04 (×2): 0.5 mg via INTRAVENOUS

## 2014-02-04 MED ORDER — OXYCODONE HCL 5 MG/5ML PO SOLN: 5.0000 mg | Freq: Once | ORAL | Status: DC | PRN

## 2014-02-04 SURGICAL SUPPLY — 31 items
APL SRG 3 HI ABS STRL LF PLS (MISCELLANEOUS) ×1
APPLICATOR COTTON TIP 6IN STRL (MISCELLANEOUS) ×12 IMPLANT
APPLICATOR DR MATTHEWS STRL (MISCELLANEOUS) ×3 IMPLANT
BANDAGE EYE OVAL (MISCELLANEOUS) ×6 IMPLANT
CAUTERY EYE LOW TEMP 1300F FIN (OPHTHALMIC RELATED) IMPLANT
CLOSURE WOUND 1/4X4 (GAUZE/BANDAGES/DRESSINGS)
COVER MAYO STAND STRL (DRAPES) ×3 IMPLANT
COVER TABLE BACK 60X90 (DRAPES) ×3 IMPLANT
DRAPE SURG 17X23 STRL (DRAPES) ×6 IMPLANT
DRAPE U-SHAPE 76X120 STRL (DRAPES) ×2 IMPLANT
GLOVE BIO SURGEON STRL SZ 6.5 (GLOVE) ×2 IMPLANT
GLOVE BIO SURGEONS STRL SZ 6.5 (GLOVE) ×1
GLOVE BIOGEL M STRL SZ7.5 (GLOVE) ×6 IMPLANT
GOWN STRL REUS W/ TWL LRG LVL3 (GOWN DISPOSABLE) ×1 IMPLANT
GOWN STRL REUS W/TWL LRG LVL3 (GOWN DISPOSABLE) ×3
GOWN STRL REUS W/TWL XL LVL3 (GOWN DISPOSABLE) ×3 IMPLANT
NS IRRIG 1000ML POUR BTL (IV SOLUTION) ×3 IMPLANT
PACK BASIN DAY SURGERY FS (CUSTOM PROCEDURE TRAY) ×3 IMPLANT
SHEET MEDIUM DRAPE 40X70 STRL (DRAPES) IMPLANT
SPEAR EYE SURG WECK-CEL (MISCELLANEOUS) ×6 IMPLANT
STRIP CLOSURE SKIN 1/4X4 (GAUZE/BANDAGES/DRESSINGS) IMPLANT
SUT 6 0 SILK T G140 8DA (SUTURE) IMPLANT
SUT MERSILENE 6 0 S14 DA (SUTURE) IMPLANT
SUT PLAIN 6 0 TG1408 (SUTURE) IMPLANT
SUT SILK 4 0 C 3 735G (SUTURE) ×2 IMPLANT
SUT VICRYL 6 0 S 28 (SUTURE) ×2 IMPLANT
SUT VICRYL ABS 6-0 S29 18IN (SUTURE) IMPLANT
SYRINGE 10CC LL (SYRINGE) ×3 IMPLANT
TOWEL OR 17X24 6PK STRL BLUE (TOWEL DISPOSABLE) ×3 IMPLANT
TOWEL OR NON WOVEN STRL DISP B (DISPOSABLE) ×3 IMPLANT
TRAY DSU PREP LF (CUSTOM PROCEDURE TRAY) ×3 IMPLANT

## 2014-02-04 NOTE — Interval H&P Note (Signed)
History and Physical Interval Note:  02/04/2014 9:50 AM  Ellen Mosley  has presented today for surgery, with the diagnosis of RIGHT HYPERTROPIA  The various methods of treatment have been discussed with the patient and family. After consideration of risks, benefits and other options for treatment, the patient has consented to  Procedure(s): REPAIR STRABISMUS RIGHT EYE (Right) as a surgical intervention .  The patient's history has been reviewed, patient examined, no change in status, stable for surgery.  I have reviewed the patient's chart and labs.  Questions were answered to the patient's satisfaction.     Shara BlazingWilliam O Tyller Bowlby

## 2014-02-04 NOTE — Anesthesia Procedure Notes (Signed)
Procedure Name: LMA Insertion Performed by: York GricePEARSON, Rayonna Heldman W Pre-anesthesia Checklist: Patient identified, Timeout performed, Emergency Drugs available, Suction available and Patient being monitored Patient Re-evaluated:Patient Re-evaluated prior to inductionOxygen Delivery Method: Circle system utilized Preoxygenation: Pre-oxygenation with 100% oxygen Intubation Type: IV induction Ventilation: Mask ventilation without difficulty LMA: LMA flexible inserted LMA Size: 4.0 Number of attempts: 1 Placement Confirmation: positive ETCO2 Tube secured with: Tape Dental Injury: Teeth and Oropharynx as per pre-operative assessment

## 2014-02-04 NOTE — H&P (View-Only) (Signed)
  Date of examination:  01-12-14  Indication for surgery: To straighten the eyes and relieve double vision  Pertinent past medical history:  Past Medical History  Diagnosis Date  . 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 Jaw clicking     Pertinent ocular history:  Head trauma 2004, right orbital fracture s/p repair  Pertinent family history:  Family History  Problem Relation Age of Onset  . Osteopenia Mother   . Anesthesia problems Mother     post-op N/V  . Cancer Maternal Aunt   . Cancer Maternal Grandmother   . Anesthesia problems Father     post-op N/V    General:  Healthy appearing patient in no distress.    Eyes:    Acuity cc    OD 20/20  OS 20/20  External: Within normal limits     Anterior segment: Within normal limits     Motility:   R H(T) = 6 in primary.  0 in L gaze, 30 in R gaze.  + HTT to right.  DMR 3 deg excyclo OD.  3+ RIO OA  Refraction: Manifest low minus/mild cyl OU  Heart: Regular rate and rhythm without murmur     Lungs: Clear to auscultation     Abdomen: Soft, nontender, normal bowel sounds     Impression:RH(T) c/w RSO palsy, traumatic by hx  Plan: RIO recess  Dennise Bamber O Wylee Ogden 

## 2014-02-04 NOTE — Transfer of Care (Signed)
Immediate Anesthesia Transfer of Care Note  Patient: Ellen Mosley  Procedure(s) Performed: Procedure(s): REPAIR STRABISMUS RIGHT EYE (Right)  Patient Location: PACU  Anesthesia Type:General  Level of Consciousness: awake and sedated  Airway & Oxygen Therapy: Patient Spontanous Breathing and Patient connected to face mask oxygen  Post-op Assessment: Report given to PACU RN and Post -op Vital signs reviewed and stable  Post vital signs: Reviewed and stable  Complications: No apparent anesthesia complications

## 2014-02-04 NOTE — Anesthesia Preprocedure Evaluation (Signed)
Anesthesia Evaluation  Patient identified by MRN, date of birth, ID band Patient awake    Reviewed: Allergy & Precautions, H&P , NPO status , Patient's Chart, lab work & pertinent test results  Airway Mallampati: I TM Distance: >3 FB Neck ROM: Full    Dental no notable dental hx. (+) Teeth Intact, Dental Advisory Given   Pulmonary neg pulmonary ROS,  breath sounds clear to auscultation  Pulmonary exam normal       Cardiovascular negative cardio ROS  Rhythm:Regular Rate:Normal     Neuro/Psych negative neurological ROS  negative psych ROS   GI/Hepatic negative GI ROS, Neg liver ROS,   Endo/Other  negative endocrine ROS  Renal/GU negative Renal ROS  negative genitourinary   Musculoskeletal   Abdominal   Peds  Hematology negative hematology ROS (+)   Anesthesia Other Findings   Reproductive/Obstetrics negative OB ROS                           Anesthesia Physical Anesthesia Plan  ASA: I  Anesthesia Plan: General   Post-op Pain Management:    Induction: Intravenous  Airway Management Planned: LMA  Additional Equipment:   Intra-op Plan:   Post-operative Plan: Extubation in OR  Informed Consent: I have reviewed the patients History and Physical, chart, labs and discussed the procedure including the risks, benefits and alternatives for the proposed anesthesia with the patient or authorized representative who has indicated his/her understanding and acceptance.   Dental advisory given  Plan Discussed with: CRNA  Anesthesia Plan Comments:         Anesthesia Quick Evaluation  

## 2014-02-04 NOTE — Anesthesia Postprocedure Evaluation (Signed)
  Anesthesia Post-op Note  Patient: Ellen Mosley  Procedure(s) Performed: Procedure(s): REPAIR STRABISMUS RIGHT EYE (Right)  Patient Location: PACU  Anesthesia Type:General  Level of Consciousness: awake and alert   Airway and Oxygen Therapy: Patient Spontanous Breathing  Post-op Pain: mild  Post-op Assessment: Post-op Vital signs reviewed, Patient's Cardiovascular Status Stable and Respiratory Function Stable  Post-op Vital Signs: Reviewed  Filed Vitals:   02/04/14 1115  BP: 138/72  Pulse: 88  Temp:   Resp: 16    Complications: No apparent anesthesia complications

## 2014-02-04 NOTE — Discharge Instructions (Signed)
Diet: Clear liquids, advance to soft foods then regular diet as tolerated.  Pain control:   1)  Ibuprofen 600 mg by mouth every 6-8 hours as needed for pain  2)  Percocet 7.5/325 one or two by mouth as needed every 4-6 hours as needed for pain that is not resolved by ibuprofen  Eye medications:  None  Activity: No swimming for 1 week.  It is OK to let water run over the face and eyes while showering or taking a bath, even during the first week.  No other restriction on exercise or activity.  Call Dr. Roxy CedarYoung's office [phone] with any problems or concerns.   Post Anesthesia Home Care Instructions  Activity: Get plenty of rest for the remainder of the day. A responsible adult should stay with you for 24 hours following the procedure.  For the next 24 hours, DO NOT: -Drive a car -Advertising copywriterperate machinery -Drink alcoholic beverages -Take any medication unless instructed by your physician -Make any legal decisions or sign important papers.  Meals: Start with liquid foods such as gelatin or soup. Progress to regular foods as tolerated. Avoid greasy, spicy, heavy foods. If nausea and/or vomiting occur, drink only clear liquids until the nausea and/or vomiting subsides. Call your physician if vomiting continues.  Special Instructions/Symptoms: Your throat may feel dry or sore from the anesthesia or the breathing tube placed in your throat during surgery. If this causes discomfort, gargle with warm salt water. The discomfort should disappear within 24 hours.

## 2014-02-04 NOTE — Op Note (Signed)
02/04/2014  10:33 AM  PATIENT:  Ellen Mosley  23 y.o. female  PRE-OPERATIVE DIAGNOSIS:  Right hypertropia  POST-OPERATIVE DIAGNOSIS:  same  PROCEDURE:    Inferior oblique muscle recession, right  SURGEON:  Pasty SpillersWilliam O.Maple HudsonYoung, M.D.   ANESTHESIA:   general  COMPLICATIONS:None  DESCRIPTION OF PROCEDURE: The patient was taken to the operating room where She was identified by me. General anesthesia was induced without difficulty after placement of appropriate monitors. The patient was prepped and draped in standard sterile fashion. A lid speculum was placed in the right eye.  Through an inferotemporal fornix incision through conjunctiva and Tenon's fascia, the right lateral rectus muscle was engaged on a Gass hook, which was used to draw a traction suture of 4-0 silk under the muscle. This was used to draw the muscle up and in. Using 2 muscle hooks through the conjunctival incision for exposure, the right inferior oblique muscle was identified and engaged on oblique hook. The muscle was drawn forward and cleared of its fascial attachments all the way to its insertion, which was secured with a fine curved hemostat. The muscle was disinserted. Its cut end was secured with a double-arm 6-0 Vicryl suture, with a locking bite at each border of the muscle. The right inferior rectus muscle was engaged on a series of muscle hooks. A mark was made on the sclera 3 mm posterior and 3 mm temporal to the temporal border of the inferior rectus insertion, and this was used as the exit point for the pole sutures of the inferior oblique, which were passed in crossed swords fashion and tied securely. Conjunctiva was closed with 2 6-0 Vicryl sutures.  Tobradex ointment was placed in the right eye.  The patient was awakened without difficulty and taken to the recovery room in stable condition, having suffered no intraoperative or immediate postoperative complications.  Pasty SpillersWilliam O. Kim Lauver M.D.    PATIENT DISPOSITION:   PACU - hemodynamically stable.

## 2014-02-08 ENCOUNTER — Encounter (HOSPITAL_BASED_OUTPATIENT_CLINIC_OR_DEPARTMENT_OTHER): Payer: Self-pay | Admitting: Ophthalmology

## 2014-08-02 ENCOUNTER — Ambulatory Visit (INDEPENDENT_AMBULATORY_CARE_PROVIDER_SITE_OTHER): Payer: 59 | Admitting: Nurse Practitioner

## 2014-08-02 ENCOUNTER — Encounter: Payer: Self-pay | Admitting: Nurse Practitioner

## 2014-08-02 VITALS — BP 114/72 | HR 76 | Ht 68.0 in | Wt 144.0 lb

## 2014-08-02 DIAGNOSIS — N3000 Acute cystitis without hematuria: Secondary | ICD-10-CM

## 2014-08-02 DIAGNOSIS — Z01419 Encounter for gynecological examination (general) (routine) without abnormal findings: Secondary | ICD-10-CM

## 2014-08-02 DIAGNOSIS — Z113 Encounter for screening for infections with a predominantly sexual mode of transmission: Secondary | ICD-10-CM

## 2014-08-02 DIAGNOSIS — Z0000 Encounter for general adult medical examination without abnormal findings: Secondary | ICD-10-CM

## 2014-08-02 LAB — POCT URINALYSIS DIPSTICK
Bilirubin, UA: NEGATIVE
Blood, UA: NEGATIVE
Glucose, UA: NEGATIVE
Ketones, UA: NEGATIVE
LEUKOCYTES UA: NEGATIVE
Nitrite, UA: POSITIVE
PROTEIN UA: NEGATIVE
UROBILINOGEN UA: NEGATIVE
pH, UA: 6

## 2014-08-02 LAB — HEMOGLOBIN, FINGERSTICK: Hemoglobin, fingerstick: 14.3 g/dL (ref 12.0–16.0)

## 2014-08-02 MED ORDER — NITROFURANTOIN MONOHYD MACRO 100 MG PO CAPS: 100.0000 mg | ORAL_CAPSULE | Freq: Two times a day (BID) | ORAL | Status: DC

## 2014-08-02 MED ORDER — DAPSONE 5 % EX GEL: 1.0000 [drp] | Freq: Two times a day (BID) | CUTANEOUS | Status: DC

## 2014-08-02 NOTE — Progress Notes (Signed)
Patient ID: Ellen Mosley, female   DOB: 03/30/1991, 23 y.o.   MRN: [id number] 23 y.o. G0P0 Single Caucasian Fe here for annual exam.  Menses is irregular at maybe eery other month.  She still has pain with deep penetration that feels like it hits the cervix and IUD.  Positional changes do help.  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  Now hoping to get job in [place] working with horses. Later wants to turn their family ranch into a training area for disability.   Not dating currently ended last relationship when came back from [place] a week ago.  Started having urgency, frequency about a week and half ago.  She is concerned that acne seems to be worse since on Mirena IUD.  She does not want to go back to dermatologist - she was advised to take oral med's like Accutane and did not want that.  Patient's last menstrual period was 06/17/2014.          Sexually active: yes  The current method of family planning is IUD. Mirena IUD 05/25/13 Exercising: yes Home exercise routine includes horseback riding daily and gym occasionally.  Smoker: no   Health Maintenance:  Pap: 07/13/12, WNL  TDaP: 03/2007  Gardasil: completed in 2007  Labs:  HB:  14.3   Urine:  Positive Nitrites   reports that she has never smoked. She has never used smokeless tobacco. She reports that she drinks alcohol. She reports that she does not use illicit drugs.  Past Medical History  Diagnosis Date  . 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 Jaw clicking     Past Surgical History  Procedure Laterality Date  . Closed reduction forearm fracture Left     x 2  . Incision and drainage Left 09/18/2004    multiple cat bites left index finger; left ring MCP joint  . Facial reconstruction surgery Right 2004    orbit  . Strabismus surgery Right 02/04/2014    Procedure: REPAIR STRABISMUS RIGHT EYE;  Surgeon: Shara BlazingWilliam O Young, MD;  Location: [facility];  Service: Ophthalmology;   Laterality: Right;    Current Outpatient Prescriptions  Medication Sig Dispense Refill  . levonorgestrel (MIRENA) 20 MCG/24HR IUD 1 each by Intrauterine route once.      . Dapsone 5 % topical gel Apply 1 drop topically 2 (two) times daily.  30 g  0  . nitrofurantoin, macrocrystal-monohydrate, (MACROBID) 100 MG capsule Take 1 capsule (100 mg total) by mouth 2 (two) times daily.  14 capsule  0   No current facility-administered medications for this visit.    Family History  Problem Relation Age of Onset  . Osteopenia Mother   . Anesthesia problems Mother     post-op N/V  . Cancer Maternal Aunt   . Cancer Maternal Grandmother   . Anesthesia problems Father     post-op N/V    ROS:  Pertinent items are noted in HPI.  Otherwise, a comprehensive ROS was negative.  Exam:   BP 114/72  Pulse 76  Ht 5\' 8"  (1.727 m)  Wt 144 lb (65.318 kg)  BMI 21.90 kg/m2  LMP 06/17/2014 Height: 5\' 8"  (172.7 cm)  Ht Readings from Last 3 Encounters:  08/02/14 5\' 8"  (1.727 m)  02/04/14 5\' 7"  (1.702 m)  02/04/14 5\' 7"  (1.702 m)    General appearance: alert, cooperative and appears stated age Head: Normocephalic, without obvious abnormality, atraumatic Neck: no adenopathy, supple,  symmetrical, trachea midline and thyroid normal to inspection and palpation Lungs: clear to auscultation bilaterally Breasts: normal appearance, no masses or tenderness Heart: regular rate and rhythm Abdomen: soft, non-tender; no masses,  no organomegaly Extremities: extremities normal, atraumatic, no cyanosis or edema Skin: Skin color, texture, turgor normal. No rashes or lesions Lymph nodes: Cervical, supraclavicular, and axillary nodes normal. No abnormal inguinal nodes palpated Neurologic: Grossly normal   Pelvic: External genitalia:  no lesions              Urethra:  normal appearing urethra with no masses, tenderness or lesions              Bartholin's and Skene's: normal                 Vagina: normal appearing  vagina with normal color and discharge, no lesions              Cervix: anteverted  IUD strings are visible              Pap taken: Yes.   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

## 2014-08-02 NOTE — Patient Instructions (Signed)
General topics  Next pap or exam is  due in 1 year Take a Women's multivitamin Take 1200 mg. of calcium daily - prefer dietary If any concerns in interim to call back  Breast Self-Awareness Practicing breast self-awareness may pick up problems early, prevent significant medical complications, and possibly save your life. By practicing breast self-awareness, you can become familiar with how your breasts look and feel and if your breasts are changing. This allows you to notice changes early. It can also offer you some reassurance that your breast health is good. One way to learn what is normal for your breasts and whether your breasts are changing is to do a breast self-exam. If you find a lump or something that was not present in the past, it is best to contact your caregiver right away. Other findings that should be evaluated by your caregiver include nipple discharge, especially if it is bloody; skin changes or reddening; areas where the skin seems to be pulled in (retracted); or new lumps and bumps. Breast pain is seldom associated with cancer (malignancy), but should also be evaluated by a caregiver. BREAST SELF-EXAM The best time to examine your breasts is 5 7 days after your menstrual period is over.  ExitCare Patient Information 2013 ExitCare[employer] LLC.   Exercise to Stay Healthy Exercise helps you become and stay healthy. EXERCISE IDEAS AND TIPS Choose exercises that:  You enjoy.  Fit into your day. You do not need to exercise really hard to be healthy. You can do exercises at a slow or medium level and stay healthy. You can:  Stretch before and after working out.  Try yoga, Pilates, or tai chi.  Lift weights.  Walk fast, swim, jog, run, climb stairs, bicycle, dance, or rollerskate.  Take aerobic classes. Exercises that burn about 150 calories:  Running 1  miles in 15 minutes.  Playing volleyball for 45 to 60 minutes.  Washing and waxing a car for 45 to 60  minutes.  Playing touch football for 45 minutes.  Walking 1  miles in 35 minutes.  Pushing a stroller 1  miles in 30 minutes.  Playing basketball for 30 minutes.  Raking leaves for 30 minutes.  Bicycling 5 miles in 30 minutes.  Walking 2 miles in 30 minutes.  Dancing for 30 minutes.  Shoveling snow for 15 minutes.  Swimming laps for 20 minutes.  Walking up stairs for 15 minutes.  Bicycling 4 miles in 15 minutes.  Gardening for 30 to 45 minutes.  Jumping rope for 15 minutes.  Washing windows or floors for 45 to 60 minutes. Document Released: 11/09/2010 Document Revised: 12/30/2011 Document Reviewed: 11/09/2010 ExitCare Patient Information 2013 ExitCare[employer] LLC.   Other topics ( that may be useful information):    Sexually Transmitted Disease Sexually transmitted disease (STD) refers to any infection that is passed from person to person during sexual activity. This may happen by way of saliva, semen, blood, vaginal mucus, or urine. Common STDs include:  Gonorrhea.  Chlamydia.  Syphilis.  HIV/AIDS.  Genital herpes.  Hepatitis B and C.  Trichomonas.  Human papillomavirus (HPV).  Pubic lice. CAUSES  An STD may be spread by bacteria, virus, or parasite. A person can get an STD by:  Sexual intercourse with an infected person.  Sharing sex toys with an infected person.  Sharing needles with an infected person.  Having intimate contact with the genitals, mouth, or rectal areas of an infected person. SYMPTOMS  Some people may not have any symptoms, but   they can still pass the infection to others. Different STDs have different symptoms. Symptoms include:  Painful or bloody urination.  Pain in the pelvis, abdomen, vagina, anus, throat, or eyes.  Skin rash, itching, irritation, growths, or sores (lesions). These usually occur in the genital or anal area.  Abnormal vaginal discharge.  Penile discharge in men.  Soft, flesh-colored skin growths in the  genital or anal area.  Fever.  Pain or bleeding during sexual intercourse.  Swollen glands in the groin area.  Yellow skin and eyes (jaundice). This is seen with hepatitis. DIAGNOSIS  To make a diagnosis, your caregiver may:  Take a medical history.  Perform a physical exam.  Take a specimen (culture) to be examined.  Examine a sample of discharge under a microscope.  Perform blood test TREATMENT   Chlamydia, gonorrhea, trichomonas, and syphilis can be cured with antibiotic medicine.  Genital herpes, hepatitis, and HIV can be treated, but not cured, with prescribed medicines. The medicines will lessen the symptoms.  Genital warts from HPV can be treated with medicine or by freezing, burning (electrocautery), or surgery. Warts may come back.  HPV is a virus and cannot be cured with medicine or surgery.However, abnormal areas may be followed very closely by your caregiver and may be removed from the cervix, vagina, or vulva through office procedures or surgery. If your diagnosis is confirmed, your recent sexual partners need treatment. This is true even if they are symptom-free or have a negative culture or evaluation. They should not have sex until their caregiver says it is okay. HOME CARE INSTRUCTIONS  All sexual partners should be informed, tested, and treated for all STDs.  Take your antibiotics as directed. Finish them even if you start to feel better.  Only take over-the-counter or prescription medicines for pain, discomfort, or fever as directed by your caregiver.  Rest.  Eat a balanced diet and drink enough fluids to keep your urine clear or pale yellow.  Do not have sex until treatment is completed and you have followed up with your caregiver. STDs should be checked after treatment.  Keep all follow-up appointments, Pap tests, and blood tests as directed by your caregiver.  Only use latex condoms and water-soluble lubricants during sexual activity. Do not use  petroleum jelly or oils.  Avoid alcohol and illegal drugs.  Get vaccinated for HPV and hepatitis. If you have not received these vaccines in the past, talk to your caregiver about whether one or both might be right for you.  Avoid risky sex practices that can break the skin. The only way to avoid getting an STD is to avoid all sexual activity.Latex condoms and dental dams (for oral sex) will help lessen the risk of getting an STD, but will not completely eliminate the risk. SEEK MEDICAL CARE IF:   You have a fever.  You have any new or worsening symptoms. Document Released: 12/28/2002 Document Revised: 12/30/2011 Document Reviewed: 01/04/2011 [facility] Patient Information 2013 [employer].    Domestic Abuse You are being battered or abused if someone close to you hits, pushes, or physically hurts you in any way. You also are being abused if you are forced into activities. You are being sexually abused if you are forced to have sexual contact of any kind. You are being emotionally abused if you are made to feel worthless or if you are constantly threatened. It is important to remember that help is available. No one has the right to abuse you. PREVENTION OF FURTHER  ABUSE  Learn the warning signs of danger. This varies with situations but may include: the use of alcohol, threats, isolation from friends and family, or forced sexual contact. Leave if you feel that violence is going to occur.  If you are attacked or beaten, report it to the police so the abuse is documented. You do not have to press charges. The police can protect you while you or the attackers are leaving. Get the officer's name and badge number and a copy of the report.  Find someone you can trust and tell them what is happening to you: your caregiver, a nurse, clergy member, close friend or family member. Feeling ashamed is natural, but remember that you have done nothing wrong. No one deserves abuse. Document Released:  10/04/2000 Document Revised: 12/30/2011 Document Reviewed: 12/13/2010 ExitCare Patient Information 2013 ExitCare[employer].    How Much is Too Much Alcohol? Drinking too much alcohol can cause injury, accidents, and health problems. These types of problems can include:   Car crashes.  Falls.  Family fighting (domestic violence).  Drowning.  Fights.  Injuries.  Burns.  Damage to certain organs.  Having a baby with birth defects. ONE DRINK CAN BE TOO MUCH WHEN YOU ARE:  Working.  Pregnant or breastfeeding.  Taking medicines. Ask your doctor.  Driving or planning to drive. If you or someone you know has a drinking problem, get help from a doctor.  Document Released: 08/03/2009 Document Revised: 12/30/2011 Document Reviewed: 08/03/2009 ExitCare Patient Information 2013 ExitCare[employer].   Smoking Hazards Smoking cigarettes is extremely bad for your health. Tobacco smoke has over 200 known poisons in it. There are over 60 chemicals in tobacco smoke that cause cancer. Some of the chemicals found in cigarette smoke include:   Cyanide.  Benzene.  Formaldehyde.  Methanol (wood alcohol).  Acetylene (fuel used in welding torches).  Ammonia. Cigarette smoke also contains the poisonous gases nitrogen oxide and carbon monoxide.  Cigarette smokers have an increased risk of many serious medical problems and Smoking causes approximately:  90% of all lung cancer deaths in men.  80% of all lung cancer deaths in women.  90% of deaths from chronic obstructive lung disease. Compared with nonsmokers, smoking increases the risk of:  Coronary heart disease by 2 to 4 times.  Stroke by 2 to 4 times.  Men developing lung cancer by 23 times.  Women developing lung cancer by 13 times.  Dying from chronic obstructive lung diseases by 12 times.  . Smoking is the most preventable cause of death and disease in our society.  WHY IS SMOKING ADDICTIVE?  Nicotine is the chemical  agent in tobacco that is capable of causing addiction or dependence.  When you smoke and inhale, nicotine is absorbed rapidly into the bloodstream through your lungs. Nicotine absorbed through the lungs is capable of creating a powerful addiction. Both inhaled and non-inhaled nicotine may be addictive.  Addiction studies of cigarettes and spit tobacco show that addiction to nicotine occurs mainly during the teen years, when young people begin using tobacco products. WHAT ARE THE BENEFITS OF QUITTING?  There are many health benefits to quitting smoking.   Likelihood of developing cancer and heart disease decreases. Health improvements are seen almost immediately.  Blood pressure, pulse rate, and breathing patterns start returning to normal soon after quitting. QUITTING SMOKING   American Lung Association - 1-800-LUNGUSA  American Cancer Society - 1-800-ACS-2345 Document Released: 11/14/2004 Document Revised: 12/30/2011 Document Reviewed: 07/19/2009 ExitCare Patient Information 2013 ExitCare,   LLC.   Stress Management Stress is a state of physical or mental tension that often results from changes in your life or normal routine. Some common causes of stress are:  Death of a loved one.  Injuries or severe illnesses.  Getting fired or changing jobs.  Moving into a new home. Other causes may be:  Sexual problems.  Business or financial losses.  Taking on a large debt.  Regular conflict with someone at home or at work.  Constant tiredness from lack of sleep. It is not just bad things that are stressful. It may be stressful to:  Win the lottery.  Get married.  Buy a new car. The amount of stress that can be easily tolerated varies from person to person. Changes generally cause stress, regardless of the types of change. Too much stress can affect your health. It may lead to physical or emotional problems. Too little stress (boredom) may also become stressful. SUGGESTIONS TO  REDUCE STRESS:  Talk things over with your family and friends. It often is helpful to share your concerns and worries. If you feel your problem is serious, you may want to get help from a professional counselor.  Consider your problems one at a time instead of lumping them all together. Trying to take care of everything at once may seem impossible. List all the things you need to do and then start with the most important one. Set a goal to accomplish 2 or 3 things each day. If you expect to do too many in a single day you will naturally fail, causing you to feel even more stressed.  Do not use alcohol or drugs to relieve stress. Although you may feel better for a short time, they do not remove the problems that caused the stress. They can also be habit forming.  Exercise regularly - at least 3 times per week. Physical exercise can help to relieve that "uptight" feeling and will relax you.  The shortest distance between despair and hope is often a good night's sleep.  Go to bed and get up on time allowing yourself time for appointments without being rushed.  Take a short "time-out" period from any stressful situation that occurs during the day. Close your eyes and take some deep breaths. Starting with the muscles in your face, tense them, hold it for a few seconds, then relax. Repeat this with the muscles in your neck, shoulders, hand, stomach, back and legs.  Take good care of yourself. Eat a balanced diet and get plenty of rest.  Schedule time for having fun. Take a break from your daily routine to relax. HOME CARE INSTRUCTIONS   Call if you feel overwhelmed by your problems and feel you can no longer manage them on your own.  Return immediately if you feel like hurting yourself or someone else. Document Released: 04/02/2001 Document Revised: 12/30/2011 Document Reviewed: 11/23/2007 ExitCare Patient Information 2013 ExitCare, LLC.  

## 2014-08-03 LAB — URINALYSIS, MICROSCOPIC ONLY
Casts: NONE SEEN
Crystals: NONE SEEN
Squamous Epithelial / LPF: NONE SEEN

## 2014-08-03 LAB — STD PANEL
HEP B S AG: NEGATIVE
HIV: NONREACTIVE

## 2014-08-04 LAB — IPS PAP TEST WITH REFLEX TO HPV

## 2014-08-04 LAB — IPS N GONORRHOEA AND CHLAMYDIA BY PCR

## 2014-08-05 LAB — URINE CULTURE: Colony Count: >=100000

## 2014-08-07 NOTE — Progress Notes (Signed)
Encounter reviewed by Dr. Dailah Opperman Silva.  

## 2014-08-17 ENCOUNTER — Ambulatory Visit (INDEPENDENT_AMBULATORY_CARE_PROVIDER_SITE_OTHER): Payer: 59

## 2014-08-17 VITALS — BP 120/78 | HR 72 | Ht 68.0 in | Wt 143.0 lb

## 2014-08-17 DIAGNOSIS — N3000 Acute cystitis without hematuria: Secondary | ICD-10-CM

## 2014-08-17 NOTE — Progress Notes (Signed)
Pt came in for a nurse visit to give a urine TOC Pt states she has finished antibiotics but is still having some urine frequency. Pt was offered an appointment with a provider but declined. Stated to pt if her sx gets worse to call our office. Pt voiced understanding and agreed.   Culture sent to lab

## 2014-08-19 LAB — URINE CULTURE
COLONY COUNT: NO GROWTH
Organism ID, Bacteria: NO GROWTH

## 2014-08-31 ENCOUNTER — Telehealth: Payer: Self-pay | Admitting: Obstetrics and Gynecology

## 2014-08-31 DIAGNOSIS — Z3009 Encounter for other general counseling and advice on contraception: Secondary | ICD-10-CM

## 2014-08-31 NOTE — Telephone Encounter (Signed)
LMTCB, ask for triage nurse. Mirena inserted 05-2013 with Dr Tresa Resomine. Last AEX 07-2014 with Patty, reported pain with intercourse.

## 2014-08-31 NOTE — Telephone Encounter (Signed)
Pt states she has decided she hates her Mirena and wants it removed.

## 2014-08-31 NOTE — Telephone Encounter (Signed)
Patient's mom calling on behalf of patient requesting a call back to the patient to schedule an appointment to have her Mirena removed.

## 2014-09-01 ENCOUNTER — Encounter: Payer: Self-pay | Admitting: Obstetrics & Gynecology

## 2014-09-01 ENCOUNTER — Ambulatory Visit (INDEPENDENT_AMBULATORY_CARE_PROVIDER_SITE_OTHER): Payer: 59 | Admitting: Obstetrics & Gynecology

## 2014-09-01 DIAGNOSIS — Z3009 Encounter for other general counseling and advice on contraception: Secondary | ICD-10-CM

## 2014-09-01 MED ORDER — NORGESTIM-ETH ESTRAD TRIPHASIC 0.18/0.215/0.25 MG-35 MCG PO TABS: 1.0000 | ORAL_TABLET | Freq: Every day | ORAL | Status: DC

## 2014-09-01 NOTE — Progress Notes (Signed)
Patient ID: Ellen Mosley, female   DOB: 03/03/1991, 23 y.o.   MRN: [id number]  23 y.o. G0P0 Single Caucasian female presents for removal of Mirena IUD.  Has experienced increased acne and . She has been counseled about alternative forms of birth control including oral contraceptives, progesterone methods, IUD, barrier method, and sterilization.  She has decided to proceed with IUD placement.  Currently, she denies any vaginal symptoms or STD concerns.  LMP:  Patient's last menstrual period was 07/21/2014 (approximate).  Healthy female: WNWD WF, NAD  Pelvic exam: Vulva: normal female genitalia Vagina: normal vagina Cervix: Non-tender, Negative CMT, no lesions or redness, IUD string noted    Procedure:  Speculum inserted into vagina. Cervix visualized and IUD string noted.  String grasped with ringed forceps.  IUD removed without difficulty and with one pull.  IUD visualized by pt, Lorrene ReidKelly Nix, CMA, and me.  Discarded.  Pt tolerated procedure well.  Speculum removed.    A: removal of Mirena IUD Desirous of restarting Trispintec  P: Rx to pharmacy.  Risks/benefits reviewed.  Pt aware to use back up method for first month on OCP.  All questions answered.

## 2014-09-01 NOTE — Telephone Encounter (Signed)
Patient reports "Hates her IUD". Complains of weight gain, acne and positional discomfort. Really wants it out. OV scheduled today at 1045 with Dr Hyacinth MeekerMiller. Instructed to take Motrin 800 mg 1 hour prior with food.

## 2014-09-01 NOTE — Telephone Encounter (Signed)
Encounter closed

## 2014-10-25 ENCOUNTER — Encounter (HOSPITAL_COMMUNITY): Payer: Self-pay | Admitting: Emergency Medicine

## 2014-10-25 ENCOUNTER — Emergency Department (HOSPITAL_COMMUNITY)
Admission: EM | Admit: 2014-10-25 | Discharge: 2014-10-25 | Disposition: A | Discharge status: Home / Self Care | Payer: 59 | Source: Home / Self Care | Attending: Family Medicine | Admitting: Family Medicine

## 2014-10-25 DIAGNOSIS — J039 Acute tonsillitis, unspecified: Secondary | ICD-10-CM

## 2014-10-25 LAB — POCT RAPID STREP A: Streptococcus, Group A Screen (Direct): NEGATIVE

## 2014-10-25 MED ORDER — CLINDAMYCIN HCL 300 MG PO CAPS: 300.0000 mg | ORAL_CAPSULE | Freq: Three times a day (TID) | ORAL | Status: DC

## 2014-10-25 NOTE — Discharge Instructions (Signed)

## 2014-10-25 NOTE — ED Notes (Signed)
Sore throat since yesterday morning.  History of strep throat.  Reports being treated 3 weeks ago for strep and treated with z pac.  Reports quick strep screen was negative.  Culture positive.

## 2014-10-25 NOTE — ED Provider Notes (Signed)
CSN: [id number]     Arrival date & time 10/25/14  1119 History   First MD Initiated Contact with Patient 10/25/14 1140     Chief Complaint  Patient presents with  . Sore Throat   (Consider location/radiation/quality/duration/timing/severity/associated sxs/prior Treatment) HPI Comments: Reports treated for strep pharyngitis 3 weeks ago with azithromycin. Treated at another local urgent care center PCP: none Unemployed  Patient is a 24 y.o. female presenting with pharyngitis. The history is provided by the patient.  Sore Throat This is a new problem. The current episode started yesterday. The problem occurs constantly. The problem has been gradually worsening. Associated symptoms comments: +fever.    Past Medical History  Diagnosis Date  . 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 Jaw clicking    Past Surgical History  Procedure Laterality Date  . Closed reduction forearm fracture Left     x 2  . Incision and drainage Left 09/18/2004    multiple cat bites left index finger; left ring MCP joint  . Facial reconstruction surgery Right 2004    orbit  . Strabismus surgery Right 02/04/2014    Procedure: REPAIR STRABISMUS RIGHT EYE;  Surgeon: Shara Blazing, MD;  Location: [facility];  Service: Ophthalmology;  Laterality: Right;   Family History  Problem Relation Age of Onset  . Osteopenia Mother   . Anesthesia problems Mother     post-op N/V  . Cancer Maternal Aunt   . Cancer Maternal Grandmother   . Anesthesia problems Father     post-op N/V   History  Substance Use Topics  . Smoking status: Never Smoker   . Smokeless tobacco: Never Used  . Alcohol Use: Yes     Comment: occasionally   OB History    Gravida Para Term Preterm AB TAB SAB Ectopic Multiple Living   0              Review of Systems  All other systems reviewed and are negative.   Allergies  Review of patient's allergies indicates no known allergies.  Home Medications   Prior to Admission  medications   Medication Sig Start Date End Date Taking? Authorizing Provider  clindamycin (CLEOCIN) 300 MG capsule Take 1 capsule (300 mg total) by mouth 3 (three) times daily. X 7 days 10/25/14   Mathis Fare Noelene Gang, PA  Dapsone 5 % topical gel Apply 1 drop topically 2 (two) times daily. 08/02/14   Lauro Franklin, FNP  Norgestimate-Ethinyl Estradiol Triphasic 0.18/0.215/0.25 MG-35 MCG tablet Take 1 tablet by mouth daily. 09/01/14   Annamaria Boots, MD   BP 109/80 mmHg  Pulse 85  Temp(Src) 98.3 F (36.8 C) (Oral)  Resp 16  SpO2 100%  LMP 09/24/2014 Physical Exam  Constitutional: She is oriented to person, place, and time. She appears well-developed and well-nourished. No distress.  HENT:  Head: Normocephalic and atraumatic.  Right Ear: Hearing, tympanic membrane, external ear and ear canal normal.  Left Ear: Hearing, tympanic membrane, external ear and ear canal normal.  Nose: Nose normal.  Mouth/Throat: Uvula is midline and mucous membranes are normal. No oral lesions. No trismus in the jaw. No uvula swelling. Oropharyngeal exudate and posterior oropharyngeal erythema present. No posterior oropharyngeal edema or tonsillar abscesses.    Eyes: Conjunctivae are normal. No scleral icterus.  Neck: Normal range of motion. Neck supple.  Cardiovascular: Normal rate, regular rhythm and normal heart sounds.   Pulmonary/Chest: Effort normal and breath sounds normal. No stridor. No respiratory distress. She has  no wheezes.  Musculoskeletal: Normal range of motion.  Lymphadenopathy:    She has no cervical adenopathy.  Neurological: She is alert and oriented to person, place, and time.  Skin: Skin is warm and dry. No rash noted. No erythema.  Psychiatric: She has a normal mood and affect. Her behavior is normal.  Nursing note and vitals reviewed.   ED Course  Procedures (including critical care time) Labs Review Labs Reviewed  POCT RAPID STREP A ([facility] ONLY)    Imaging  Review No results found.   MDM   1. Tonsillitis   Rapid strep negative. Specimen sent for 3 day throat culture. Exam indicative of tonsillitis. Will treat for atypical causes with oral clindamycin and advise follow up either here or with ENT if no improvement.     Ria ClockJennifer Lee H Sheryl Towell, GeorgiaPA 10/25/14 1245

## 2014-10-27 LAB — CULTURE, GROUP A STREP

## 2015-01-05 ENCOUNTER — Encounter: Payer: Self-pay | Admitting: Nurse Practitioner

## 2015-01-05 ENCOUNTER — Telehealth: Payer: Self-pay | Admitting: Nurse Practitioner

## 2015-01-05 ENCOUNTER — Ambulatory Visit (INDEPENDENT_AMBULATORY_CARE_PROVIDER_SITE_OTHER): Payer: 59 | Admitting: Nurse Practitioner

## 2015-01-05 VITALS — BP 110/60 | Temp 97.7°F | Ht 68.0 in | Wt 145.0 lb

## 2015-01-05 DIAGNOSIS — N760 Acute vaginitis: Secondary | ICD-10-CM | POA: Diagnosis not present

## 2015-01-05 DIAGNOSIS — R3915 Urgency of urination: Secondary | ICD-10-CM | POA: Diagnosis not present

## 2015-01-05 LAB — POCT URINALYSIS DIPSTICK
PH UA: 5
UROBILINOGEN UA: NEGATIVE

## 2015-01-05 LAB — WET PREP BY MOLECULAR PROBE
Candida species: NEGATIVE
GARDNERELLA VAGINALIS: POSITIVE — AB
Trichomonas vaginosis: NEGATIVE

## 2015-01-05 MED ORDER — NITROFURANTOIN MONOHYD MACRO 100 MG PO CAPS: 100.0000 mg | ORAL_CAPSULE | Freq: Two times a day (BID) | ORAL | Status: DC

## 2015-01-05 NOTE — Progress Notes (Signed)
S:  23 y.o.SW female G0  presents with complaint of UTI. Symptoms began on  4 days ago. With symptoms of dysuria, urinary frequency, urinary urgency and lower abdominal pressure.   The patient is having no constitutional symptoms, denying fever, chills, anorexia, or weight loss.   Sexually active: yes  Symptoms maybe related to post coital.   Current method of birth control is OCP.   Vaginal dryness: no.   Same partner without change. Last UTI documented 08/17/14.  ROS: no weight loss, fever, night sweats and feels well  O alert, oriented to person, place, and time, normal mood, behavior, speech, dress, motor activity, and thought processes   thin, healthy,  alert and  not in acute distress  Mild lower abdominal pressure  No CVA tenderness  Pelvic:  Not tender at the urethra, cervix normal in appearance, external genitalia normal, no adnexal masses or tenderness, no cervical motion tenderness and thin white discharge   Diagnostic Test:    Urinalysis:  Trace leuk's   PROCEDURE:  Urine culture, urine micro, Affirm  Assessment:  R/O UTI   R/O vaginitis  Plan:  Maintain adequate hydration. Follow up if symptoms not improving, and as needed.   Medication Therapy: Macrobid 100 mg BID # 14   Lab: urine culture and micro, Affirm        RV

## 2015-01-05 NOTE — Patient Instructions (Signed)

## 2015-01-05 NOTE — Telephone Encounter (Signed)
Patient has a UTI and wants to come in today after 3pm. 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

## 2015-01-05 NOTE — Telephone Encounter (Signed)
Spoke with patient. Patient would like to be seen today for UTI symptoms after 4pm. "I keep feeling like I need to go to the bathroom and then when I go there is very little that comes out." Symptoms began 4 days ago. Has slight lower back pain. Denies fever, chills, pressure, or burning with urination. Appointment scheduled for today at 4:15pm with Lauro FranklinPatricia Rolen-Grubb, FNP (day and time per Kennon RoundsSally). Patient is agreeable.  Routing to provider for final review. Patient agreeable to disposition. Will close encounter

## 2015-01-06 ENCOUNTER — Other Ambulatory Visit: Payer: Self-pay | Admitting: Nurse Practitioner

## 2015-01-06 LAB — URINALYSIS, MICROSCOPIC ONLY
Bacteria, UA: NONE SEEN
CRYSTALS: NONE SEEN
Casts: NONE SEEN
Squamous Epithelial / LPF: NONE SEEN

## 2015-01-06 LAB — URINE CULTURE
Colony Count: NO GROWTH
Organism ID, Bacteria: NO GROWTH

## 2015-01-06 MED ORDER — METRONIDAZOLE 0.75 % VA GEL: 1.0000 | Freq: Every day | VAGINAL | Status: DC

## 2015-01-09 ENCOUNTER — Telehealth: Payer: Self-pay | Admitting: Nurse Practitioner

## 2015-01-09 NOTE — Telephone Encounter (Signed)
Patient is requesting a different medication.(metrogel). This medication is not approved by her insurance. [facility] is using [facility] [address].

## 2015-01-09 NOTE — Telephone Encounter (Signed)
Patient requesting new rx for treatment of BV. Patient was seen on 3/17 with Lauro FranklinPatricia Rolen-Grubb, FNP. Metrogel is not covered under patient's insurance. Please advise.

## 2015-01-09 NOTE — Telephone Encounter (Signed)
Routed to triage 

## 2015-01-09 NOTE — Telephone Encounter (Signed)
Not sure if Flagyl 500 mg BID # 14 is covered or not since in the same family of Metrogel.  If she takes this make sure she is aware of potential GI SE and no ETOH use.

## 2015-01-10 MED ORDER — METRONIDAZOLE 500 MG PO TABS: 500.0000 mg | ORAL_TABLET | Freq: Two times a day (BID) | ORAL | Status: DC

## 2015-01-10 NOTE — Telephone Encounter (Signed)
Spoke with patient. Advised patient of message as seen below from Lauro FranklinPatricia Rolen-Grubb, FNP. Patient is agreeable. Rx for Flagyl 500mg  BID #14 0RF [facility].   Routing to provider for final review. Patient agreeable to disposition. Will close encounter

## 2015-01-12 NOTE — Progress Notes (Signed)
Encounter reviewed by Dr. Brook Silva.  

## 2015-07-24 ENCOUNTER — Other Ambulatory Visit: Payer: Self-pay | Admitting: Obstetrics & Gynecology

## 2015-07-24 NOTE — Telephone Encounter (Signed)
Medication refill request: tri previfem  Last AEX:  08/02/14 PG Next AEX: 08/07/15 PG Last MMG (if hormonal medication request): never Refill authorized: 09/01/14 #3packs/3R. Today #1pack/0R?

## 2015-08-07 ENCOUNTER — Ambulatory Visit: Payer: 59 | Admitting: Nurse Practitioner

## 2015-08-07 ENCOUNTER — Encounter: Payer: Self-pay | Admitting: Nurse Practitioner

## 2015-11-21 ENCOUNTER — Telehealth: Payer: Self-pay | Admitting: Nurse Practitioner

## 2015-11-21 NOTE — Telephone Encounter (Signed)
Patient wants to start back on birth control she would like to talk with the nurse.

## 2015-11-21 NOTE — Telephone Encounter (Signed)
Spoke with patient. Her last aex was performed on 08/02/2014. Advised she will need to be seen in the office for her aex prior to restarting on OCP. She is agreeable. Currently scheduled for aex on 12/06/2015. She would like to move this appointment up. 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 Appointment scheduled for tomorrow 11/22/2015 at 4 pm with Ria Comment, FNP. She is agreeable to the date and time.  Routing to provider for final review. Patient agreeable to disposition. Will close encounter.

## 2015-11-22 ENCOUNTER — Encounter: Payer: Self-pay | Admitting: Nurse Practitioner

## 2015-11-22 ENCOUNTER — Ambulatory Visit (INDEPENDENT_AMBULATORY_CARE_PROVIDER_SITE_OTHER): Payer: BC Managed Care – PPO | Admitting: Nurse Practitioner

## 2015-11-22 VITALS — BP 110/66 | HR 60 | Ht 68.0 in | Wt 147.0 lb

## 2015-11-22 DIAGNOSIS — Z113 Encounter for screening for infections with a predominantly sexual mode of transmission: Secondary | ICD-10-CM | POA: Diagnosis not present

## 2015-11-22 DIAGNOSIS — Z01419 Encounter for gynecological examination (general) (routine) without abnormal findings: Secondary | ICD-10-CM | POA: Diagnosis not present

## 2015-11-22 DIAGNOSIS — Z0000 Encounter for general adult medical examination without abnormal findings: Secondary | ICD-10-CM

## 2015-11-22 LAB — POCT URINALYSIS DIPSTICK
Bilirubin, UA: NEGATIVE
Glucose, UA: NEGATIVE
Ketones, UA: NEGATIVE
Leukocytes, UA: NEGATIVE
Nitrite, UA: NEGATIVE
Protein, UA: NEGATIVE
Urobilinogen, UA: NEGATIVE
pH, UA: 6

## 2015-11-22 MED ORDER — NORGESTIM-ETH ESTRAD TRIPHASIC 0.18/0.215/0.25 MG-35 MCG PO TABS: 1.0000 | ORAL_TABLET | Freq: Every day | ORAL | Status: DC

## 2015-11-22 NOTE — Progress Notes (Signed)
Patient ID: Ellen Mosley, female   DOB: [birth year], 25 y.o.   MRN: [id number]  25 y.o. G0P0000 Single  Caucasian Fe here for annual exam.  Menses last 4 days.  Some cramps off OCP.  SA on 11/11/15 and took Plan B on 1/222 with normal menses now.  Off OCP for 2 months and now feels like she may have had a recurrence of OV cyst this past cycle.  She has met someone on line and thinks this is the "perfect one".   Patient's last menstrual period was 11/19/2015 (exact date).          Sexually active: Yes.    The current method of family planning is none.    Exercising: Yes.    works out with Physiological scientist, goes to gym three days per week Smoker:  no  Health Maintenance: Pap: 08/02/14, Negative  TDaP: 03/22/07 Gardasil: completed in 2007 HIV: 08/02/14 Labs: HB: 12.5   Urine: Moderate RBC - on menses   reports that she has never smoked. She has never used smokeless tobacco. She reports that she drinks alcohol. She reports that she does not use illicit drugs.  Past Medical History  Diagnosis Date  . 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 Jaw clicking     Past Surgical History  Procedure Laterality Date  . Closed reduction forearm fracture Left     x 2  . Incision and drainage Left 09/18/2004    multiple cat bites left index finger; left ring MCP joint  . Facial reconstruction surgery Right 2004    orbit  . Strabismus surgery Right 02/04/2014    Procedure: REPAIR STRABISMUS RIGHT EYE;  Surgeon: Derry Skill, MD;  Location: [place];  Service: Ophthalmology;  Laterality: Right;    Current Outpatient Prescriptions  Medication Sig Dispense Refill  . Norgestimate-Ethinyl Estradiol Triphasic (TRI-PREVIFEM) 0.18/0.215/0.25 MG-35 MCG tablet Take 1 tablet by mouth daily. 3 Package 0   No current facility-administered medications for this visit.    Family History  Problem Relation Age of Onset  . Osteopenia Mother   . Anesthesia problems Mother     post-op N/V  . Cancer  Maternal Aunt   . Cancer Maternal Grandmother   . Anesthesia problems Father     post-op N/V    ROS:  Pertinent items are noted in HPI.  Otherwise, a comprehensive ROS was negative.  Exam:   BP 110/66 mmHg  Pulse 60  Ht 5' 8"  (1.727 m)  Wt 147 lb (66.679 kg)  BMI 22.36 kg/m2  LMP 11/19/2015 (Exact Date) Height: 5' 8"  (172.7 cm) Ht Readings from Last 3 Encounters:  11/22/15 5' 8"  (1.727 m)  01/05/15 5' 8"  (1.727 m)  09/01/14 5' 8"  (1.727 m)    General appearance: alert, cooperative and appears stated age Head: Normocephalic, without obvious abnormality, atraumatic Neck: no adenopathy, supple, symmetrical, trachea midline and thyroid normal to inspection and palpation Lungs: clear to auscultation bilaterally Breasts: normal appearance, no masses or tenderness Heart: regular rate and rhythm Abdomen: soft, non-tender; no masses,  no organomegaly Extremities: extremities normal, atraumatic, no cyanosis or edema Skin: Skin color, texture, turgor normal. No rashes or lesions Lymph nodes: Cervical, supraclavicular, and axillary nodes normal. No abnormal inguinal nodes palpated Neurologic: Grossly normal   Pelvic: External genitalia:  no lesions              Urethra:  normal appearing urethra with no masses, tenderness or lesions  Bartholin's and Skene's: normal                 Vagina: normal appearing vagina with normal color and discharge, no lesions              Cervix: anteverted              Pap taken: Yes.   Bimanual Exam:  Uterus:  normal size, contour, position, consistency, mobility, non-tender              Adnexa: no mass, fullness, tenderness               Rectovaginal: Confirms               Anus:  normal sphincter tone, no lesions  Chaperone present: yes  A:  Well Woman with normal exam  Mirena IUD for contraception 8/5//2014 - 09/01/14  OCP until 2 months ago  History of OV cyst Remote history of LGSIL 2010 with colpo and no biopsy -  normal since  R/O STD's Acne    P:   Reviewed health and wellness pertinent to exam  Pap smear as above  Refill OCP for a year, discussed BUM, potentia side effects  Will follow with labs and pap  Counseled on breast self exam, STD prevention, HIV risk factors and prevention, use and side effects of OCP's, adequate intake of calcium and vitamin D, diet and exercise return annually or prn  An After Visit Summary was printed and given to the patient.

## 2015-11-22 NOTE — Patient Instructions (Signed)
General topics  Next pap or exam is  due in 1 year Take a Women's multivitamin Take 1200 mg. of calcium daily - prefer dietary If any concerns in interim to call back  Breast Self-Awareness Practicing breast self-awareness may pick up problems early, prevent significant medical complications, and possibly save your life. By practicing breast self-awareness, you can become familiar with how your breasts look and feel and if your breasts are changing. This allows you to notice changes early. It can also offer you some reassurance that your breast health is good. One way to learn what is normal for your breasts and whether your breasts are changing is to do a breast self-exam. If you find a lump or something that was not present in the past, it is best to contact your caregiver right away. Other findings that should be evaluated by your caregiver include nipple discharge, especially if it is bloody; skin changes or reddening; areas where the skin seems to be pulled in (retracted); or new lumps and bumps. Breast pain is seldom associated with cancer (malignancy), but should also be evaluated by a caregiver. BREAST SELF-EXAM The best time to examine your breasts is 5 7 days after your menstrual period is over.  ExitCare Patient Information 2013 ExitCare[employer] LLC.   Exercise to Stay Healthy Exercise helps you become and stay healthy. EXERCISE IDEAS AND TIPS Choose exercises that:  You enjoy.  Fit into your day. You do not need to exercise really hard to be healthy. You can do exercises at a slow or medium level and stay healthy. You can:  Stretch before and after working out.  Try yoga, Pilates, or tai chi.  Lift weights.  Walk fast, swim, jog, run, climb stairs, bicycle, dance, or rollerskate.  Take aerobic classes. Exercises that burn about 150 calories:  Running 1  miles in 15 minutes.  Playing volleyball for 45 to 60 minutes.  Washing and waxing a car for 45 to 60  minutes.  Playing touch football for 45 minutes.  Walking 1  miles in 35 minutes.  Pushing a stroller 1  miles in 30 minutes.  Playing basketball for 30 minutes.  Raking leaves for 30 minutes.  Bicycling 5 miles in 30 minutes.  Walking 2 miles in 30 minutes.  Dancing for 30 minutes.  Shoveling snow for 15 minutes.  Swimming laps for 20 minutes.  Walking up stairs for 15 minutes.  Bicycling 4 miles in 15 minutes.  Gardening for 30 to 45 minutes.  Jumping rope for 15 minutes.  Washing windows or floors for 45 to 60 minutes. Document Released: 11/09/2010 Document Revised: 12/30/2011 Document Reviewed: 11/09/2010 ExitCare Patient Information 2013 ExitCare[employer] LLC.   Other topics ( that may be useful information):    Sexually Transmitted Disease Sexually transmitted disease (STD) refers to any infection that is passed from person to person during sexual activity. This may happen by way of saliva, semen, blood, vaginal mucus, or urine. Common STDs include:  Gonorrhea.  Chlamydia.  Syphilis.  HIV/AIDS.  Genital herpes.  Hepatitis B and C.  Trichomonas.  Human papillomavirus (HPV).  Pubic lice. CAUSES  An STD may be spread by bacteria, virus, or parasite. A person can get an STD by:  Sexual intercourse with an infected person.  Sharing sex toys with an infected person.  Sharing needles with an infected person.  Having intimate contact with the genitals, mouth, or rectal areas of an infected person. SYMPTOMS  Some people may not have any symptoms, but   they can still pass the infection to others. Different STDs have different symptoms. Symptoms include:  Painful or bloody urination.  Pain in the pelvis, abdomen, vagina, anus, throat, or eyes.  Skin rash, itching, irritation, growths, or sores (lesions). These usually occur in the genital or anal area.  Abnormal vaginal discharge.  Penile discharge in men.  Soft, flesh-colored skin growths in the  genital or anal area.  Fever.  Pain or bleeding during sexual intercourse.  Swollen glands in the groin area.  Yellow skin and eyes (jaundice). This is seen with hepatitis. DIAGNOSIS  To make a diagnosis, your caregiver may:  Take a medical history.  Perform a physical exam.  Take a specimen (culture) to be examined.  Examine a sample of discharge under a microscope.  Perform blood test TREATMENT   Chlamydia, gonorrhea, trichomonas, and syphilis can be cured with antibiotic medicine.  Genital herpes, hepatitis, and HIV can be treated, but not cured, with prescribed medicines. The medicines will lessen the symptoms.  Genital warts from HPV can be treated with medicine or by freezing, burning (electrocautery), or surgery. Warts may come back.  HPV is a virus and cannot be cured with medicine or surgery.However, abnormal areas may be followed very closely by your caregiver and may be removed from the cervix, vagina, or vulva through office procedures or surgery. If your diagnosis is confirmed, your recent sexual partners need treatment. This is true even if they are symptom-free or have a negative culture or evaluation. They should not have sex until their caregiver says it is okay. HOME CARE INSTRUCTIONS  All sexual partners should be informed, tested, and treated for all STDs.  Take your antibiotics as directed. Finish them even if you start to feel better.  Only take over-the-counter or prescription medicines for pain, discomfort, or fever as directed by your caregiver.  Rest.  Eat a balanced diet and drink enough fluids to keep your urine clear or pale yellow.  Do not have sex until treatment is completed and you have followed up with your caregiver. STDs should be checked after treatment.  Keep all follow-up appointments, Pap tests, and blood tests as directed by your caregiver.  Only use latex condoms and water-soluble lubricants during sexual activity. Do not use  petroleum jelly or oils.  Avoid alcohol and illegal drugs.  Get vaccinated for HPV and hepatitis. If you have not received these vaccines in the past, talk to your caregiver about whether one or both might be right for you.  Avoid risky sex practices that can break the skin. The only way to avoid getting an STD is to avoid all sexual activity.Latex condoms and dental dams (for oral sex) will help lessen the risk of getting an STD, but will not completely eliminate the risk. SEEK MEDICAL CARE IF:   You have a fever.  You have any new or worsening symptoms. Document Released: 12/28/2002 Document Revised: 12/30/2011 Document Reviewed: 01/04/2011 [facility] Patient Information 2013 [employer].    Domestic Abuse You are being battered or abused if someone close to you hits, pushes, or physically hurts you in any way. You also are being abused if you are forced into activities. You are being sexually abused if you are forced to have sexual contact of any kind. You are being emotionally abused if you are made to feel worthless or if you are constantly threatened. It is important to remember that help is available. No one has the right to abuse you. PREVENTION OF FURTHER  ABUSE  Learn the warning signs of danger. This varies with situations but may include: the use of alcohol, threats, isolation from friends and family, or forced sexual contact. Leave if you feel that violence is going to occur.  If you are attacked or beaten, report it to the police so the abuse is documented. You do not have to press charges. The police can protect you while you or the attackers are leaving. Get the officer's name and badge number and a copy of the report.  Find someone you can trust and tell them what is happening to you: your caregiver, a nurse, clergy member, close friend or family member. Feeling ashamed is natural, but remember that you have done nothing wrong. No one deserves abuse. Document Released:  10/04/2000 Document Revised: 12/30/2011 Document Reviewed: 12/13/2010 ExitCare Patient Information 2013 ExitCare[employer].    How Much is Too Much Alcohol? Drinking too much alcohol can cause injury, accidents, and health problems. These types of problems can include:   Car crashes.  Falls.  Family fighting (domestic violence).  Drowning.  Fights.  Injuries.  Burns.  Damage to certain organs.  Having a baby with birth defects. ONE DRINK CAN BE TOO MUCH WHEN YOU ARE:  Working.  Pregnant or breastfeeding.  Taking medicines. Ask your doctor.  Driving or planning to drive. If you or someone you know has a drinking problem, get help from a doctor.  Document Released: 08/03/2009 Document Revised: 12/30/2011 Document Reviewed: 08/03/2009 ExitCare Patient Information 2013 ExitCare[employer].   Smoking Hazards Smoking cigarettes is extremely bad for your health. Tobacco smoke has over 200 known poisons in it. There are over 60 chemicals in tobacco smoke that cause cancer. Some of the chemicals found in cigarette smoke include:   Cyanide.  Benzene.  Formaldehyde.  Methanol (wood alcohol).  Acetylene (fuel used in welding torches).  Ammonia. Cigarette smoke also contains the poisonous gases nitrogen oxide and carbon monoxide.  Cigarette smokers have an increased risk of many serious medical problems and Smoking causes approximately:  90% of all lung cancer deaths in men.  80% of all lung cancer deaths in women.  90% of deaths from chronic obstructive lung disease. Compared with nonsmokers, smoking increases the risk of:  Coronary heart disease by 2 to 4 times.  Stroke by 2 to 4 times.  Men developing lung cancer by 23 times.  Women developing lung cancer by 13 times.  Dying from chronic obstructive lung diseases by 12 times.  . Smoking is the most preventable cause of death and disease in our society.  WHY IS SMOKING ADDICTIVE?  Nicotine is the chemical  agent in tobacco that is capable of causing addiction or dependence.  When you smoke and inhale, nicotine is absorbed rapidly into the bloodstream through your lungs. Nicotine absorbed through the lungs is capable of creating a powerful addiction. Both inhaled and non-inhaled nicotine may be addictive.  Addiction studies of cigarettes and spit tobacco show that addiction to nicotine occurs mainly during the teen years, when young people begin using tobacco products. WHAT ARE THE BENEFITS OF QUITTING?  There are many health benefits to quitting smoking.   Likelihood of developing cancer and heart disease decreases. Health improvements are seen almost immediately.  Blood pressure, pulse rate, and breathing patterns start returning to normal soon after quitting. QUITTING SMOKING   American Lung Association - 1-800-LUNGUSA  American Cancer Society - 1-800-ACS-2345 Document Released: 11/14/2004 Document Revised: 12/30/2011 Document Reviewed: 07/19/2009 ExitCare Patient Information 2013 ExitCare,   LLC.   Stress Management Stress is a state of physical or mental tension that often results from changes in your life or normal routine. Some common causes of stress are:  Death of a loved one.  Injuries or severe illnesses.  Getting fired or changing jobs.  Moving into a new home. Other causes may be:  Sexual problems.  Business or financial losses.  Taking on a large debt.  Regular conflict with someone at home or at work.  Constant tiredness from lack of sleep. It is not just bad things that are stressful. It may be stressful to:  Win the lottery.  Get married.  Buy a new car. The amount of stress that can be easily tolerated varies from person to person. Changes generally cause stress, regardless of the types of change. Too much stress can affect your health. It may lead to physical or emotional problems. Too little stress (boredom) may also become stressful. SUGGESTIONS TO  REDUCE STRESS:  Talk things over with your family and friends. It often is helpful to share your concerns and worries. If you feel your problem is serious, you may want to get help from a professional counselor.  Consider your problems one at a time instead of lumping them all together. Trying to take care of everything at once may seem impossible. List all the things you need to do and then start with the most important one. Set a goal to accomplish 2 or 3 things each day. If you expect to do too many in a single day you will naturally fail, causing you to feel even more stressed.  Do not use alcohol or drugs to relieve stress. Although you may feel better for a short time, they do not remove the problems that caused the stress. They can also be habit forming.  Exercise regularly - at least 3 times per week. Physical exercise can help to relieve that "uptight" feeling and will relax you.  The shortest distance between despair and hope is often a good night's sleep.  Go to bed and get up on time allowing yourself time for appointments without being rushed.  Take a short "time-out" period from any stressful situation that occurs during the day. Close your eyes and take some deep breaths. Starting with the muscles in your face, tense them, hold it for a few seconds, then relax. Repeat this with the muscles in your neck, shoulders, hand, stomach, back and legs.  Take good care of yourself. Eat a balanced diet and get plenty of rest.  Schedule time for having fun. Take a break from your daily routine to relax. HOME CARE INSTRUCTIONS   Call if you feel overwhelmed by your problems and feel you can no longer manage them on your own.  Return immediately if you feel like hurting yourself or someone else. Document Released: 04/02/2001 Document Revised: 12/30/2011 Document Reviewed: 11/23/2007 ExitCare Patient Information 2013 ExitCare, LLC.  

## 2015-11-23 LAB — STD PANEL
HIV: NONREACTIVE
Hepatitis B Surface Ag: NEGATIVE

## 2015-11-23 LAB — HEMOGLOBIN, FINGERSTICK: Hemoglobin, fingerstick: 12.5 g/dL (ref 12.0–16.0)

## 2015-11-23 NOTE — Progress Notes (Signed)
Encounter reviewed by Dr. Brook Amundson C. Silva.  

## 2015-11-27 LAB — IPS N GONORRHOEA AND CHLAMYDIA BY PCR

## 2015-11-29 ENCOUNTER — Other Ambulatory Visit: Payer: Self-pay | Admitting: Nurse Practitioner

## 2015-11-29 LAB — IPS PAP TEST WITH REFLEX TO HPV

## 2015-11-29 MED ORDER — METRONIDAZOLE 0.75 % VA GEL: 1.0000 | Freq: Every day | VAGINAL | Status: DC

## 2015-12-06 ENCOUNTER — Ambulatory Visit: Payer: 59 | Admitting: Nurse Practitioner

## 2015-12-18 ENCOUNTER — Telehealth: Payer: Self-pay | Admitting: Nurse Practitioner

## 2015-12-18 NOTE — Telephone Encounter (Signed)
Spoke with patient. Patient states that she started Tri-Previfem at the beginning of this month. Reports since starting the Tri-Previfem she has been crying more frequently, feeling more gloomy, and emotional. Reports she did not feel this way prior to start OCP. States that she has used the Nuvaring before for birth control and did not have any side effects. Requesting to restart on Nuvaring or for further medication recommendations. Advised I will speak with Ria Comment, FNP and return call with additional recommendations. She is agreeable.

## 2015-12-18 NOTE — Telephone Encounter (Signed)
Return call to Kaitlyn. °

## 2015-12-18 NOTE — Telephone Encounter (Signed)
Left message to call Kaitlyn at [phone]. 

## 2015-12-18 NOTE — Telephone Encounter (Signed)
Patient called requesting to speak with the nurse about "problems with increased depression."

## 2015-12-19 NOTE — Telephone Encounter (Signed)
I agree that her emotional symptoms could be from this pill as it is triphasic.  But in her chart she took this prior so I just restarted the same.  It is OK to change to another pill - monophasic or the Nuva Ring.  Her preference and we can send in RX for a year.

## 2015-12-19 NOTE — Telephone Encounter (Signed)
Left message to call Amylia Collazos at [phone]. 

## 2015-12-21 MED ORDER — NORETHIN ACE-ETH ESTRAD-FE 1-20 MG-MCG(24) PO TABS: 1.0000 | ORAL_TABLET | Freq: Every day | ORAL | Status: DC

## 2015-12-21 NOTE — Telephone Encounter (Signed)
Rx for Loestrin 24 Fe #1 11RF until her next aex sent to pharmacy on file.  Routing to provider for final review. Patient agreeable to disposition. Will close encounter.

## 2015-12-21 NOTE — Telephone Encounter (Signed)
Spoke with patient. Patient states that she would like to start on a monophasic oral birth control at this time. Advised I will let the covering provider know so this can be sent in to her pharmacy. She is agreeable. States she is in the last week of her current pack. Aware she will need to start new OCP when she is due to start a new pack of pills.  Ellen Mosley CNM please review and advise.

## 2015-12-21 NOTE — Telephone Encounter (Signed)
Loestrin 24 fe generic is acceptable

## 2015-12-21 NOTE — Telephone Encounter (Signed)
Patient left a message after hours returning your call.

## 2016-12-09 NOTE — Progress Notes (Deleted)
Patient ID: Ellen Mosley, female   DOB: 10-14-1991, 26 y.o.   MRN: [id number]  26 y.o. G0P0000 Single  Caucasian Fe here for annual exam.    No LMP recorded.          Sexually active: {yes no:314532}  The current method of family planning is {contraception:315051}.    Exercising: {yes no:314532}  {types:19826} Smoker:  {YES [id number]  Health Maintenance: Pap: 11/22/15, Negative  08/02/14, Negative  TDaP: 03/25/07 Gardasil: completed 2007 HIV: 11/22/15 Labs: ***   reports that she has never smoked. She has never used smokeless tobacco. She reports that she drinks alcohol. She reports that she does not use drugs.  Past Medical History:  Diagnosis Date  . 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 Jaw clicking     Past Surgical History:  Procedure Laterality Date  . CLOSED REDUCTION FOREARM FRACTURE Left    x 2  . FACIAL RECONSTRUCTION SURGERY Right 2004   orbit  . INCISION AND DRAINAGE Left 09/18/2004   multiple cat bites left index finger; left ring MCP joint  . STRABISMUS SURGERY Right 02/04/2014   Procedure: REPAIR STRABISMUS RIGHT EYE;  Surgeon: Shara Blazing, MD;  Location: [facility];  Service: Ophthalmology;  Laterality: Right;    Current Outpatient Prescriptions  Medication Sig Dispense Refill  . metroNIDAZOLE (METROGEL) 0.75 % vaginal gel Place 1 Applicatorful vaginally at bedtime. 70 g 0  . Norethindrone Acetate-Ethinyl Estrad-FE (LOESTRIN 24 FE) 1-20 MG-MCG(24) tablet Take 1 tablet by mouth daily. 1 Package 11  . Norgestimate-Ethinyl Estradiol Triphasic (TRI-PREVIFEM) 0.18/0.215/0.25 MG-35 MCG tablet Take 1 tablet by mouth daily. 3 Package 0   No current facility-administered medications for this visit.     Family History  Problem Relation Age of Onset  . Osteopenia Mother   . Anesthesia problems Mother     post-op N/V  . Cancer Maternal Aunt   . Cancer Maternal Grandmother   . Anesthesia problems Father     post-op N/V    ROS:  Pertinent items  are noted in HPI.  Otherwise, a comprehensive ROS was negative.  Exam:   There were no vitals taken for this visit.   Ht Readings from Last 3 Encounters:  11/22/15 5\' 8"  (1.727 m)  01/05/15 5\' 8"  (1.727 m)  09/01/14 5\' 8"  (1.727 m)    General appearance: alert, cooperative and appears stated age Head: Normocephalic, without obvious abnormality, atraumatic Neck: no adenopathy, supple, symmetrical, trachea midline and thyroid {EXAM; THYROID:18604} Lungs: clear to auscultation bilaterally Breasts: {Exam; breast:13139::"normal appearance, no masses or tenderness"} Heart: regular rate and rhythm Abdomen: soft, non-tender; no masses,  no organomegaly Extremities: extremities normal, atraumatic, no cyanosis or edema Skin: Skin color, texture, turgor normal. No rashes or lesions Lymph nodes: Cervical, supraclavicular, and axillary nodes normal. No abnormal inguinal nodes palpated Neurologic: Grossly normal   Pelvic: External genitalia:  no lesions              Urethra:  normal appearing urethra with no masses, tenderness or lesions              Bartholin's and Skene's: normal                 Vagina: normal appearing vagina with normal color and discharge, no lesions              Cervix: {exam; cervix:14595}              Pap taken: {yes no:314532} Bimanual Exam:  Uterus:  {exam; uterus:12215}              Adnexa: {exam; adnexa:12223}               Rectovaginal: Confirms               Anus:  normal sphincter tone, no lesions  Chaperone present: ***  A:  Well Woman with normal exam  P:   Reviewed health and wellness pertinent to exam  Pap smear as above  {plan; gyn:5269::"mammogram","pap smear","return annually or prn"}  An After Visit Summary was printed and given to the patient.

## 2016-12-10 ENCOUNTER — Encounter: Payer: Self-pay | Admitting: Nurse Practitioner

## 2016-12-10 ENCOUNTER — Ambulatory Visit: Payer: BC Managed Care – PPO | Admitting: Nurse Practitioner

## 2017-02-10 ENCOUNTER — Encounter: Payer: Self-pay | Admitting: Nurse Practitioner

## 2017-02-10 ENCOUNTER — Ambulatory Visit (INDEPENDENT_AMBULATORY_CARE_PROVIDER_SITE_OTHER): Payer: BC Managed Care – PPO | Admitting: Nurse Practitioner

## 2017-02-10 VITALS — BP 120/66 | Ht 68.25 in | Wt 154.0 lb

## 2017-02-10 DIAGNOSIS — Z01419 Encounter for gynecological examination (general) (routine) without abnormal findings: Secondary | ICD-10-CM | POA: Diagnosis not present

## 2017-02-10 DIAGNOSIS — Z113 Encounter for screening for infections with a predominantly sexual mode of transmission: Secondary | ICD-10-CM

## 2017-02-10 DIAGNOSIS — Z0000 Encounter for general adult medical examination without abnormal findings: Secondary | ICD-10-CM | POA: Diagnosis not present

## 2017-02-10 DIAGNOSIS — Z23 Encounter for immunization: Secondary | ICD-10-CM

## 2017-02-10 NOTE — Patient Instructions (Signed)

## 2017-02-10 NOTE — Progress Notes (Addendum)
Patient ID: Ellen Mosley, female   DOB: November 29, 1990, 26 y.o.   MRN: [id number]  26 y.o. G0P0000 Single  Caucasian Fe here for annual exam.  Off OCP   Was off 12/16 and 1/17 then restarted in 2 17 then off again 4/ 2017.  Using rhythm method for BC.  Partner's testosterone level is low secondary to low TSH.  If pregnancy occurs OK.  Takes women's MVI but does not want to take prenatal.  Menses now at 5 days.  Flow is moderate, some cramps that are tolerable.  Same partner for 5 months. He is 76 and they do not think they want a pregnancy.  His libido is also very low.  She does want to check STD's.  They are planning to buy a 37 ft boat and sail in the [place] for the winter months.  She is no longer riding horses as her horse has gotten older and unable to ride.  Patient's last menstrual period was 02/10/2017 (exact date).          Sexually active: Yes.    The current method of family planning is rhythm method.    Exercising: Yes.    beach volleyball 2 times per week Smoker:  no  Health Maintenance: Pap: 11/22/15, Negative  08/02/14, Negative  History of Abnormal Pap: yes, 12/2008 LGSIL, colpo with no biopsy taken Self Breast exams: yes TDaP: 03/25/07 Gardasil: completed in 2007 HIV: 11/22/15 Labs: STD testing   reports that she has never smoked. She has never used smokeless tobacco. She reports that she drinks alcohol. She reports that she does not use drugs.  Past Medical History:  Diagnosis Date  . Abnormal Pap smear of cervix 01/06/2009   LGSIL, colpo-no biopsy  . 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 Jaw clicking     Past Surgical History:  Procedure Laterality Date  . CLOSED REDUCTION FOREARM FRACTURE Left    x 2  . FACIAL RECONSTRUCTION SURGERY Right 2004   orbit  . INCISION AND DRAINAGE Left 09/18/2004   multiple cat bites left index finger; left ring MCP joint  . STRABISMUS SURGERY Right 02/04/2014   Procedure: REPAIR STRABISMUS RIGHT EYE;  Surgeon: Shara Blazing, MD;   Location: [facility];  Service: Ophthalmology;  Laterality: Right;    No current outpatient prescriptions on file.   No current facility-administered medications for this visit.     Family History  Problem Relation Age of Onset  . Osteopenia Mother   . Anesthesia problems Mother     post-op N/V  . Cancer Maternal Aunt   . Anesthesia problems Father     post-op N/V  . Cancer Maternal Grandmother     ROS:  Pertinent items are noted in HPI.  Otherwise, a comprehensive ROS was negative.  Exam:   BP 120/66 (BP Location: Right Arm, Patient Position: Sitting, Cuff Size: Normal)   Ht 5' 8.25" (1.734 m)   Wt 154 lb (69.9 kg)   LMP 02/10/2017 (Exact Date)   BMI 23.24 kg/m  Height: 5' 8.25" (173.4 cm) Ht Readings from Last 3 Encounters:  02/10/17 5' 8.25" (1.734 m)  11/22/15  (1.727 m)  01/05/15  (1.727 m)    General appearance: alert, cooperative and appears stated age Head: Normocephalic, without obvious abnormality, atraumatic Neck: no adenopathy, supple, symmetrical, trachea midline and thyroid normal to inspection and palpation Lungs: clear to auscultation bilaterally Breasts: normal appearance, no masses or tenderness Heart: regular rate and rhythm Abdomen:  soft, non-tender; no masses,  no organomegaly Extremities: extremities normal, atraumatic, no cyanosis or edema Skin: Skin color, texture, turgor normal. No rashes or lesions Lymph nodes: Cervical, supraclavicular, and axillary nodes normal. No abnormal inguinal nodes palpated Neurologic: Grossly normal   Pelvic: External genitalia:  no lesions              Urethra:  normal appearing urethra with no masses, tenderness or lesions              Bartholin's and Skene's: normal                 Vagina: normal appearing vagina with normal color and discharge, no lesions              Cervix: anteverted              Pap taken: No. Bimanual Exam:  Uterus:  normal size, contour, position, consistency,  mobility, non-tender              Adnexa: no mass, fullness, tenderness               Rectovaginal: Confirms               Anus:  normal sphincter tone, no lesions  Chaperone present: yes  A:  Well Woman with normal exam  Mirena IUD for contraception 8/5//2014 - 09/01/14             OCP stopped 01/2016             History of OV cyst - most recent pain on the right ~ 3 weeks ago Remote history of LGSIL 2010 with colpo and no biopsy - normal since  R/O STD's  Update immunizations   P:   Reviewed health and wellness pertinent to exam  Pap smear: no  Will follow with STD's  TDaP given today  She will make sure the MVI has folic acid or will add that to the MVI.  Counseled on breast self exam, STD prevention, HIV risk factors and prevention, adequate intake of calcium and vitamin D, diet and exercise return annually or prn  An After Visit Summary was printed and given to the patient.

## 2017-02-11 LAB — STD PANEL
HEP B S AG: NEGATIVE
HIV 1&2 Ab, 4th Generation: NONREACTIVE

## 2017-02-11 LAB — GC/CHLAMYDIA PROBE AMP
CT PROBE, AMP APTIMA: NOT DETECTED
GC PROBE AMP APTIMA: NOT DETECTED

## 2017-02-11 LAB — HEPATITIS C ANTIBODY: HCV Ab: NEGATIVE

## 2017-02-11 NOTE — Progress Notes (Signed)
Encounter reviewed by Dr. Janean Sark. I recommend folic acid 400 IU daily if her MVI does not have this. This will help prevent neural tube defects if she conceives a pregnancy.
